# Patient Record
Sex: Female | Born: 1955 | Race: Black or African American | Hispanic: No | Marital: Single | Smoking: Never smoker
Health system: Southern US, Community
[De-identification: ages and names within clinical notes are randomized; demographics above are authoritative.]

## PROBLEM LIST (undated history)

## (undated) HISTORY — PX: TONSILLECTOMY: SUR1361

---
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
## 2000-12-21 ENCOUNTER — Other Ambulatory Visit: Admission: RE | Admit: 2000-12-21 | Discharge: 2000-12-21 | Payer: Self-pay | Admitting: Family Medicine

## 2000-12-22 ENCOUNTER — Encounter: Payer: Self-pay | Admitting: Family Medicine

## 2000-12-22 ENCOUNTER — Ambulatory Visit (HOSPITAL_COMMUNITY): Admission: RE | Admit: 2000-12-22 | Discharge: 2000-12-22 | Payer: Self-pay | Admitting: Family Medicine

## 2001-06-28 ENCOUNTER — Encounter: Payer: Self-pay | Admitting: Family Medicine

## 2001-06-28 ENCOUNTER — Ambulatory Visit (HOSPITAL_COMMUNITY): Admission: RE | Admit: 2001-06-28 | Discharge: 2001-06-28 | Payer: Self-pay | Admitting: Family Medicine

## 2002-07-11 ENCOUNTER — Ambulatory Visit (HOSPITAL_COMMUNITY): Admission: RE | Admit: 2002-07-11 | Discharge: 2002-07-11 | Payer: Self-pay | Admitting: Family Medicine

## 2002-07-11 ENCOUNTER — Encounter: Payer: Self-pay | Admitting: Family Medicine

## 2003-07-13 ENCOUNTER — Ambulatory Visit (HOSPITAL_COMMUNITY): Admission: RE | Admit: 2003-07-13 | Discharge: 2003-07-13 | Payer: Self-pay | Admitting: Family Medicine

## 2004-07-24 ENCOUNTER — Ambulatory Visit (HOSPITAL_COMMUNITY): Admission: RE | Admit: 2004-07-24 | Discharge: 2004-07-24 | Payer: Self-pay | Admitting: Family Medicine

## 2005-07-09 ENCOUNTER — Ambulatory Visit (HOSPITAL_COMMUNITY): Admission: RE | Admit: 2005-07-09 | Discharge: 2005-07-09 | Payer: Self-pay | Admitting: Family Medicine

## 2007-07-15 ENCOUNTER — Other Ambulatory Visit: Admission: RE | Admit: 2007-07-15 | Discharge: 2007-07-15 | Payer: Self-pay | Admitting: Family Medicine

## 2007-07-19 ENCOUNTER — Ambulatory Visit (HOSPITAL_COMMUNITY): Admission: RE | Admit: 2007-07-19 | Discharge: 2007-07-19 | Payer: Self-pay | Admitting: Family Medicine

## 2010-07-23 ENCOUNTER — Other Ambulatory Visit
Admission: RE | Admit: 2010-07-23 | Discharge: 2010-07-23 | Payer: Self-pay | Source: Home / Self Care | Admitting: Family Medicine

## 2010-08-04 ENCOUNTER — Ambulatory Visit (HOSPITAL_COMMUNITY)
Admission: RE | Admit: 2010-08-04 | Discharge: 2010-08-04 | Payer: Self-pay | Source: Home / Self Care | Attending: Family Medicine | Admitting: Family Medicine

## 2012-07-14 ENCOUNTER — Other Ambulatory Visit (HOSPITAL_COMMUNITY)
Admission: RE | Admit: 2012-07-14 | Discharge: 2012-07-14 | Disposition: A | Discharge status: Home / Self Care | Payer: PRIVATE HEALTH INSURANCE | Source: Ambulatory Visit | Attending: Family Medicine | Admitting: Family Medicine

## 2012-07-14 DIAGNOSIS — Z01419 Encounter for gynecological examination (general) (routine) without abnormal findings: Secondary | ICD-10-CM | POA: Insufficient documentation

## 2012-07-14 DIAGNOSIS — Z1151 Encounter for screening for human papillomavirus (HPV): Secondary | ICD-10-CM | POA: Insufficient documentation

## 2012-07-25 ENCOUNTER — Other Ambulatory Visit (HOSPITAL_COMMUNITY): Payer: Self-pay | Admitting: Family Medicine

## 2012-07-25 DIAGNOSIS — Z139 Encounter for screening, unspecified: Secondary | ICD-10-CM

## 2012-07-26 ENCOUNTER — Ambulatory Visit (HOSPITAL_COMMUNITY)
Admission: RE | Admit: 2012-07-26 | Discharge: 2012-07-26 | Disposition: A | Discharge status: Home / Self Care | Payer: PRIVATE HEALTH INSURANCE | Source: Ambulatory Visit | Attending: Family Medicine | Admitting: Family Medicine

## 2012-07-26 DIAGNOSIS — Z1231 Encounter for screening mammogram for malignant neoplasm of breast: Secondary | ICD-10-CM | POA: Insufficient documentation

## 2012-07-26 DIAGNOSIS — Z139 Encounter for screening, unspecified: Secondary | ICD-10-CM

## 2016-06-08 ENCOUNTER — Ambulatory Visit (INDEPENDENT_AMBULATORY_CARE_PROVIDER_SITE_OTHER): Payer: PRIVATE HEALTH INSURANCE | Admitting: Family Medicine

## 2016-06-08 ENCOUNTER — Encounter: Payer: Self-pay | Admitting: Family Medicine

## 2016-06-08 VITALS — BP 128/64 | HR 88 | Temp 98.3°F | Resp 14 | Ht 66.0 in | Wt 194.0 lb

## 2016-06-08 DIAGNOSIS — Z23 Encounter for immunization: Secondary | ICD-10-CM | POA: Diagnosis not present

## 2016-06-08 DIAGNOSIS — Z0000 Encounter for general adult medical examination without abnormal findings: Secondary | ICD-10-CM

## 2016-06-08 DIAGNOSIS — Z1239 Encounter for other screening for malignant neoplasm of breast: Secondary | ICD-10-CM

## 2016-06-08 DIAGNOSIS — E669 Obesity, unspecified: Secondary | ICD-10-CM | POA: Insufficient documentation

## 2016-06-08 DIAGNOSIS — E6609 Other obesity due to excess calories: Secondary | ICD-10-CM | POA: Diagnosis not present

## 2016-06-08 DIAGNOSIS — Z124 Encounter for screening for malignant neoplasm of cervix: Secondary | ICD-10-CM

## 2016-06-08 DIAGNOSIS — Z13220 Encounter for screening for lipoid disorders: Secondary | ICD-10-CM | POA: Diagnosis not present

## 2016-06-08 DIAGNOSIS — Z1211 Encounter for screening for malignant neoplasm of colon: Secondary | ICD-10-CM | POA: Diagnosis not present

## 2016-06-08 DIAGNOSIS — Z1231 Encounter for screening mammogram for malignant neoplasm of breast: Secondary | ICD-10-CM

## 2016-06-08 DIAGNOSIS — R1030 Lower abdominal pain, unspecified: Secondary | ICD-10-CM | POA: Diagnosis not present

## 2016-06-08 DIAGNOSIS — Z6830 Body mass index (BMI) 30.0-30.9, adult: Secondary | ICD-10-CM

## 2016-06-08 LAB — CBC WITH DIFFERENTIAL/PLATELET
BASOS PCT: 0 %
Basophils Absolute: 0 cells/uL (ref 0–200)
EOS ABS: 308 {cells}/uL (ref 15–500)
EOS PCT: 4 %
HCT: 42.1 % (ref 35.0–45.0)
HEMOGLOBIN: 13.9 g/dL (ref 12.0–15.0)
LYMPHS ABS: 2464 {cells}/uL (ref 850–3900)
Lymphocytes Relative: 32 %
MCH: 27.9 pg (ref 27.0–33.0)
MCHC: 33 g/dL (ref 32.0–36.0)
MCV: 84.5 fL (ref 80.0–100.0)
MONOS PCT: 7 %
MPV: 11.2 fL (ref 7.5–12.5)
Monocytes Absolute: 539 cells/uL (ref 200–950)
NEUTROS ABS: 4389 {cells}/uL (ref 1500–7800)
Neutrophils Relative %: 57 %
PLATELETS: 270 10*3/uL (ref 140–400)
RBC: 4.98 MIL/uL (ref 3.80–5.10)
RDW: 15.9 % — ABNORMAL HIGH (ref 11.0–15.0)
WBC: 7.7 10*3/uL (ref 3.8–10.8)

## 2016-06-08 LAB — COMPREHENSIVE METABOLIC PANEL
ALK PHOS: 57 U/L (ref 33–130)
ALT: 17 U/L (ref 6–29)
AST: 19 U/L (ref 10–35)
Albumin: 4 g/dL (ref 3.6–5.1)
BILIRUBIN TOTAL: 0.4 mg/dL (ref 0.2–1.2)
BUN: 12 mg/dL (ref 7–25)
CO2: 33 mmol/L — ABNORMAL HIGH (ref 20–31)
CREATININE: 0.84 mg/dL (ref 0.50–1.05)
Calcium: 8.9 mg/dL (ref 8.6–10.4)
Chloride: 105 mmol/L (ref 98–110)
Glucose, Bld: 80 mg/dL (ref 70–99)
Potassium: 4.5 mmol/L (ref 3.5–5.3)
SODIUM: 141 mmol/L (ref 135–146)
TOTAL PROTEIN: 6.8 g/dL (ref 6.1–8.1)

## 2016-06-08 LAB — URINALYSIS, ROUTINE W REFLEX MICROSCOPIC
Bilirubin Urine: NEGATIVE
Glucose, UA: NEGATIVE
Ketones, ur: NEGATIVE
LEUKOCYTES UA: NEGATIVE
NITRITE: NEGATIVE
PH: 7 (ref 5.0–8.0)
Protein, ur: NEGATIVE
SPECIFIC GRAVITY, URINE: 1.02 (ref 1.001–1.035)

## 2016-06-08 LAB — LIPID PANEL
CHOLESTEROL: 147 mg/dL (ref <200)
HDL: 65 mg/dL (ref >50)
LDL Cholesterol: 67 mg/dL (ref <100)
Total CHOL/HDL Ratio: 2.3 Ratio (ref <5.0)
Triglycerides: 76 mg/dL (ref <150)
VLDL: 15 mg/dL (ref <30)

## 2016-06-08 LAB — URINALYSIS, MICROSCOPIC ONLY
CRYSTALS: NONE SEEN [HPF]
Casts: NONE SEEN [LPF]
Yeast: NONE SEEN [HPF]

## 2016-06-08 NOTE — Patient Instructions (Signed)
TDAP given I recommend eye visit once a year I recommend dental visit every 6 months Goal is to  Exercise 30 minutes 5 days a week We will call with lab results  F/U pending results

## 2016-06-08 NOTE — Progress Notes (Signed)
   Subjective:    Patient ID: Hannah Malone, female    DOB: [birth year], 60 y.o.   MRN: [id number]  Patient presents for New Patient CPE (is not fasting) Pt here for Physical with PAP and Smear  Previous PCP Dr. Mirna MiresGerald Hill Last seen 3 years ago. She does not follow with the GYN she is due for Pap smear, mammogram, colonoscopy, fasting labs She is single she does not have any children her sister is a patient of mine She does see a chiropractor as needed for adjustments on her back and her hips. Flu shot done at work Family history reviewed  The past couple weeks she has noted some heaviness in the lower abdominal region she denies any vaginal bleeding no change in her bowels no dysuria denies any actual pain no nausea vomiting   Review Of Systems:  GEN- denies fatigue, fever, weight loss,weakness, recent illness HEENT- denies eye drainage, change in vision, nasal discharge, CVS- denies chest pain, palpitations RESP- denies SOB, cough, wheeze ABD- denies N/V, change in stools, +abd pain GU- denies dysuria, hematuria, dribbling, incontinence MSK- denies joint pain, muscle aches, injury Neuro- denies headache, dizziness, syncope, seizure activity       Objective:    BP 128/64 (BP Location: Left Arm, Patient Position: Sitting, Cuff Size: Large)   Pulse 88   Temp 98.3 F (36.8 C) (Oral)   Resp 14   Ht 5\' 6"  (1.676 m)   Wt 194 lb (88 kg)   SpO2 98%   BMI 31.31 kg/m  GEN- NAD, alert and oriented x3 HEENT- PERRL, EOMI, non injected sclera, pink conjunctiva, MMM, oropharynx clear Neck- Supple, no thyromegaly Breast- normal symmetry, no nipple inversion,no nipple drainage, no nodules or lumps felt Nodes- no axillary nodes CVS- RRR, no murmur RESP-CTAB ABD-NABS,soft,NT,ND, no CVA tendermess GU- normal external genitalia, vaginal mucosa pink and moist, cervix visualized no growth, no blood form os, minimal thin clear discharge, no CMT, no ovarian masses, uterus normal size EXT-  No edema Psych- normal affect and mood  Pulses- Radial, DP- 2+        Assessment & Plan:      Problem List Items Addressed This Visit    Obesity   Relevant Orders   Lipid panel    Other Visit Diagnoses    Routine general medical examination at a health care facility    -  Primary   CPE Done, fasting labs, UA neg, abd pain may be some bloating/gas. Increae fiber water. schedule Mammo, colonoscopy, PAP today,TDAP done   Relevant Orders   CBC with Differential/Platelet   Comprehensive metabolic panel   Lipid panel   TSH   Screening cholesterol level       Cervical cancer screening       Relevant Orders   PAP, ThinPrep ASCUS Rflx HPV Rflx Type   Breast cancer screening       Relevant Orders   MM DIGITAL SCREENING BILATERAL   Abdominal pain, lower       Relevant Orders   Urinalysis, Routine w reflex microscopic ([facility]) (Completed)   Colon cancer screening       Relevant Orders   Ambulatory referral to Gastroenterology   Need for prophylactic vaccination with combined diphtheria-tetanus-pertussis (DTP) vaccine          Note: This dictation was prepared with Dragon dictation along with smaller phrase technology. Any transcriptional errors that result from this process are unintentional.

## 2016-06-09 LAB — TSH: TSH: 4.18 mIU/L

## 2016-06-10 ENCOUNTER — Encounter (INDEPENDENT_AMBULATORY_CARE_PROVIDER_SITE_OTHER): Payer: Self-pay | Admitting: *Deleted

## 2016-06-11 LAB — PAP THINPREP ASCUS RFLX HPV RFLX TYPE

## 2016-06-12 ENCOUNTER — Encounter: Payer: Self-pay | Admitting: *Deleted

## 2016-06-24 ENCOUNTER — Other Ambulatory Visit: Payer: Self-pay | Admitting: Family Medicine

## 2016-06-24 ENCOUNTER — Ambulatory Visit (HOSPITAL_COMMUNITY): Payer: PRIVATE HEALTH INSURANCE

## 2016-06-24 ENCOUNTER — Ambulatory Visit (HOSPITAL_COMMUNITY)
Admission: RE | Admit: 2016-06-24 | Discharge: 2016-06-24 | Disposition: A | Discharge status: Home / Self Care | Payer: PRIVATE HEALTH INSURANCE | Source: Ambulatory Visit | Attending: Family Medicine | Admitting: Family Medicine

## 2016-06-24 DIAGNOSIS — Z1231 Encounter for screening mammogram for malignant neoplasm of breast: Secondary | ICD-10-CM | POA: Diagnosis present

## 2017-06-09 ENCOUNTER — Ambulatory Visit (INDEPENDENT_AMBULATORY_CARE_PROVIDER_SITE_OTHER): Payer: Self-pay | Admitting: Nurse Practitioner

## 2017-06-09 ENCOUNTER — Encounter: Payer: Self-pay | Admitting: Gastroenterology

## 2017-06-09 ENCOUNTER — Other Ambulatory Visit: Payer: Self-pay | Admitting: Family Medicine

## 2017-06-09 VITALS — BP 115/70 | HR 78 | Temp 97.7°F | Resp 16 | Wt 195.4 lb

## 2017-06-09 DIAGNOSIS — Z0000 Encounter for general adult medical examination without abnormal findings: Secondary | ICD-10-CM

## 2017-06-09 DIAGNOSIS — Z1231 Encounter for screening mammogram for malignant neoplasm of breast: Secondary | ICD-10-CM

## 2017-06-09 NOTE — Progress Notes (Signed)
Subjective:  Hannah Malone is a 61 y.o. female who presents for basic physical exam. Patient needs a physical for work.  Patient denies any current health related concerns. Patient denies taking any medications on a daily basis.  Patient denies any medication or food allergies.  Patient states there is a familial history of DM on her father's side.  Patient does not smoke, drink or use recreational drugs.  Patient states she got her flu shot in October, 2018.  Patient states she would like to lose weight.  Immunization History  Administered Date(s) Administered  . Influenza-Unspecified 05/01/1996, 05/22/1997, 04/29/2016  . Pneumococcal Polysaccharide-23 05/01/1996  . Tdap 06/08/2016    No past medical history on file.  Past Surgical History:  Procedure Laterality Date  . TONSILLECTOMY      Social History   Tobacco Use  . Smoking status: Never Smoker  . Smokeless tobacco: Never Used  Substance Use Topics  . Alcohol use: Yes    Comment: occ  . Drug use: No    No Known Allergies  Current Outpatient Medications  Medication Sig Dispense Refill  . aspirin EC 81 MG tablet Take 81 mg by mouth daily.    . multivitamin-iron-minerals-folic acid (CENTRUM) chewable tablet Chew 1 tablet by mouth daily.     No current facility-administered medications for this visit.     Review of Systems  Constitutional: Negative.   HENT: Negative.   Eyes: Negative.   Respiratory: Negative.   Cardiovascular: Negative.   Gastrointestinal: Negative.   Genitourinary: Negative.   Musculoskeletal: Negative.   Skin: Negative.   Neurological: Negative.   Endo/Heme/Allergies: Negative.   Psychiatric/Behavioral: Negative.     BP 115/70 (BP Location: Right Arm, Patient Position: Sitting, Cuff Size: Normal)   Pulse 78   Temp 97.7 F (36.5 C) (Oral)   Resp 16   Wt 195 lb 6.4 oz (88.6 kg)   SpO2 98%   BMI 31.54 kg/m    Objective:  BP 115/70 (BP Location: Right Arm, Patient Position: Sitting,  Cuff Size: Normal)   Pulse 78   Temp 97.7 F (36.5 C) (Oral)   Resp 16   Wt 195 lb 6.4 oz (88.6 kg)   SpO2 98%   BMI 31.54 kg/m   General Appearance:  Alert, cooperative, no distress, appears stated age  Head:  Normocephalic, without obvious abnormality, atraumatic  Eyes:  PERRL, conjunctiva/corneas clear, EOM's intact, fundi benign, both eyes  Ears:  Normal TM's and external ear canals, both ears  Nose: Nares normal, septum midline,mucosa normal, no drainage or sinus tenderness  Throat: Lips, mucosa, and tongue normal; teeth and gums normal  Neck: Supple, symmetrical, trachea midline, no adenopathy;  thyroid: not enlarged, symmetric, no tenderness/mass/nodules; no carotid bruit or JVD  Back:   Symmetric, no curvature, ROM normal, no CVA tenderness  Lungs:   Clear to auscultation bilaterally, respirations unlabored  Breasts:  No masses or tenderness  Heart:  Regular rate and rhythm, S1 and S2 normal, no murmur, rub, or gallop  Abdomen:   Soft, non-tender, bowel sounds active all four quadrants,  no masses, no organomegaly  Pelvic: Deferred  Extremities: Extremities normal, atraumatic, no cyanosis or edema  Pulses: 2+ and symmetric  Skin: Skin color, texture, turgor normal, no rashes or lesions  Lymph nodes: Cervical, supraclavicular normal  Neurologic: Normal        Assessment:  basic physical exam    Plan:  Patient education provided regarding weight gain, and how to prevent hypertension and hyperlipidemia.  No labs needed at this time.  Patient will follow up with PCP. Patient will follow up with PCP.

## 2017-06-09 NOTE — Patient Instructions (Addendum)
Preventing Unhealthy Weight Gain, Adult Staying at a healthy weight is important. When fat builds up in your body, you may become overweight or obese. These conditions put you at greater risk for developing certain health problems, such as heart disease, diabetes, sleeping problems, joint problems, and some cancers. Unhealthy weight gain is often the result of making unhealthy choices in what you eat. It is also a result of not getting enough exercise. You can make changes to your lifestyle to prevent obesity and stay as healthy as possible. What nutrition changes can be made? To maintain a healthy weight and prevent obesity:  Eat only as much as your body needs. To do this: ? Pay attention to signs that you are hungry or full. Stop eating as soon as you feel full. ? If you feel hungry, try drinking water first. Drink enough water so your urine is clear or pale yellow. ? Eat smaller portions. ? Look at serving sizes on food labels. Most foods contain more than one serving per container. ? Eat the recommended amount of calories for your gender and activity level. While most active people should eat around 2,000 calories per day, if you are trying to lose weight or are not very active, you main need to eat less calories. Talk to your health care provider or dietitian about how many calories you should eat each day.  Choose healthy foods, such as: ? Fruits and vegetables. Try to fill at least half of your plate at each meal with fruits and vegetables. ? Whole grains, such as whole wheat bread, brown rice, and quinoa. ? Lean meats, such as chicken or fish. ? Other healthy proteins, such as beans, eggs, or tofu. ? Healthy fats, such as nuts, seeds, fatty fish, and olive oil. ? Low-fat or fat-free dairy.  Check food labels and avoid food and drinks that: ? Are high in calories. ? Have added sugar. ? Are high in sodium. ? Have saturated fats or trans fats.  Limit how much you eat of the following  foods: ? Prepackaged meals. ? Fast food. ? Fried foods. ? Processed meat, such as bacon, sausage, and deli meats. ? Fatty cuts of red meat and poultry with skin.  Cook foods in healthier ways, such as by baking, broiling, or grilling.  When grocery shopping, try to shop around the outside of the store. This helps you buy mostly fresh foods and avoid canned and prepackaged foods.  What lifestyle changes can be made?  Exercise at least 30 minutes 5 or more days each week. Exercising includes brisk walking, yard work, biking, running, swimming, and team sports like basketball and soccer. Ask your health care provider which exercises are safe for you.  Do not use any products that contain nicotine or tobacco, such as cigarettes and e-cigarettes. If you need help quitting, ask your health care provider.  Limit alcohol intake to no more than 1 drink a day for nonpregnant women and 2 drinks a day for men. One drink equals 12 oz of beer, 5 oz of wine, or 1 oz of hard liquor.  Try to get 7-9 hours of sleep each night. What other changes can be made?  Keep a food and activity journal to keep track of: ? What you ate and how many calories you had. Remember to count sauces, dressings, and side dishes. ? Whether you were active, and what exercises you did. ? Your calorie, weight, and activity goals.  Check your weight regularly. Track any changes.   If you notice you have gained weight, make changes to your diet or activity routine.  Avoid taking weight-loss medicines or supplements. Talk to your health care provider before starting any new medicine or supplement.  Talk to your health care provider before trying any new diet or exercise plan. Why are these changes important? Eating healthy, staying active, and having healthy habits not only help prevent obesity, they also:  Help you to manage stress and emotions.  Help you to connect with friends and family.  Improve your  self-esteem.  Improve your sleep.  Prevent long-term health problems.  What can happen if changes are not made? Being obese or overweight can cause you to develop joint or bone problems, which can make it hard for you to stay active or do activities you enjoy. Being obese or overweight also puts stress on your heart and lungs and can lead to health problems like diabetes, heart disease, and some cancers. Where to find more information: Talk with your health care provider or a dietitian about healthy eating and healthy lifestyle choices. You may also find other information through these resources:  U.S. Department of Agriculture MyPlate: https://ball-collins.biz/www.choosemyplate.gov  American Heart Association: www.heart.org  Centers for Disease Control and Prevention: FootballExhibition.com.brwww.cdc.gov  Summary  Staying at a healthy weight is important. It helps prevent certain diseases and health problems, such as heart disease, diabetes, joint problems, sleep disorders, and some cancers.  Being obese or overweight can cause you to develop joint or bone problems, which can make it hard for you to stay active or do activities you enjoy.  You can prevent unhealthy weight gain by eating a healthy diet, exercising regularly, not smoking, limiting alcohol, and getting enough sleep.  Talk with your health care provider or a dietitian for guidance about healthy eating and healthy lifestyle choices. This information is not intended to replace advice given to you by your health care provider. Make sure you discuss any questions you have with your health care provider. Document Released: 07/14/2016 Document Revised: 08/19/2016 Document Reviewed: 08/19/2016 Elsevier Interactive Patient Education  2018 [employer]  Heart-Healthy Eating Plan Many factors influence your heart health, including eating and exercise habits. Heart (coronary) risk increases with abnormal blood fat (lipid) levels. Heart-healthy meal planning includes limiting  unhealthy fats, increasing healthy fats, and making other small dietary changes. This includes maintaining a healthy body weight to help keep lipid levels within a normal range. What is my plan? Your health care provider recommends that you:  Get no more than _________% of the total calories in your daily diet from fat.  Limit your intake of saturated fat to less than _________% of your total calories each day.  Limit the amount of cholesterol in your diet to less than _________ mg per day.  What types of fat should I choose?  Choose healthy fats more often. Choose monounsaturated and polyunsaturated fats, such as olive oil and canola oil, flaxseeds, walnuts, almonds, and seeds.  Eat more omega-3 fats. Good choices include salmon, mackerel, sardines, tuna, flaxseed oil, and ground flaxseeds. Aim to eat fish at least two times each week.  Limit saturated fats. Saturated fats are primarily found in animal products, such as meats, butter, and cream. Plant sources of saturated fats include palm oil, palm kernel oil, and coconut oil.  Avoid foods with partially hydrogenated oils in them. These contain trans fats. Examples of foods that contain trans fats are stick margarine, some tub margarines, cookies, crackers, and other baked goods. What  general guidelines do I need to follow?  Check food labels carefully to identify foods with trans fats or high amounts of saturated fat.  Fill one half of your plate with vegetables and green salads. Eat 4-5 servings of vegetables per day. A serving of vegetables equals 1 cup of raw leafy vegetables,  cup of raw or cooked cut-up vegetables, or  cup of vegetable juice.  Fill one fourth of your plate with whole grains. Look for the word "whole" as the first word in the ingredient list.  Fill one fourth of your plate with lean protein foods.  Eat 4-5 servings of fruit per day. A serving of fruit equals one medium whole fruit,  cup of dried fruit,  cup of  fresh, frozen, or canned fruit, or  cup of 100% fruit juice.  Eat more foods that contain soluble fiber. Examples of foods that contain this type of fiber are apples, broccoli, carrots, beans, peas, and barley. Aim to get 20-30 g of fiber per day.  Eat more home-cooked food and less restaurant, buffet, and fast food.  Limit or avoid alcohol.  Limit foods that are high in starch and sugar.  Avoid fried foods.  Cook foods by using methods other than frying. Baking, boiling, grilling, and broiling are all great options. Other fat-reducing suggestions include: ? Removing the skin from poultry. ? Removing all visible fats from meats. ? Skimming the fat off of stews, soups, and gravies before serving them. ? Steaming vegetables in water or broth.  Lose weight if you are overweight. Losing just 5-10% of your initial body weight can help your overall health and prevent diseases such as diabetes and heart disease.  Increase your consumption of nuts, legumes, and seeds to 4-5 servings per week. One serving of dried beans or legumes equals  cup after being cooked, one serving of nuts equals 1 ounces, and one serving of seeds equals  ounce or 1 tablespoon.  You may need to monitor your salt (sodium) intake, especially if you have high blood pressure. Talk with your health care provider or dietitian to get more information about reducing sodium. What foods can I eat? Grains  Breads, including [place], white, pita, wheat, raisin, rye, oatmeal, and [place]. Tortillas that are neither fried nor made with lard or trans fat. Low-fat rolls, including hotdog and hamburger buns and English muffins. Biscuits. Muffins. Waffles. Pancakes. Light popcorn. Whole-grain cereals. Flatbread. Melba toast. Pretzels. Breadsticks. Rusks. Low-fat snacks and crackers, including oyster, saltine, matzo, graham, animal, and rye. Rice and pasta, including brown rice and those that are made with whole wheat. Vegetables All  vegetables. Fruits All fruits, but limit coconut. Meats and Other Protein Sources Lean, well-trimmed beef, veal, pork, and lamb. Chicken and [place] without skin. All fish and shellfish. Wild duck, rabbit, pheasant, and venison. Egg whites or low-cholesterol egg substitutes. Dried beans, peas, lentils, and tofu.Seeds and most nuts. Dairy Low-fat or nonfat cheeses, including ricotta, string, and mozzarella. Skim or 1% milk that is liquid, powdered, or evaporated. Buttermilk that is made with low-fat milk. Nonfat or low-fat yogurt. Beverages Mineral water. Diet carbonated beverages. Sweets and Desserts Sherbets and fruit ices. Honey, jam, marmalade, jelly, and syrups. Meringues and gelatins. Pure sugar candy, such as hard candy, jelly beans, gumdrops, mints, marshmallows, and small amounts of dark chocolate. [employer]. Eat all sweets and desserts in moderation. Fats and Oils Nonhydrogenated (trans-free) margarines. Vegetable oils, including soybean, sesame, sunflower, olive, peanut, safflower, corn, canola, and cottonseed. Salad dressings  or mayonnaise that are made with a vegetable oil. Limit added fats and oils that you use for cooking, baking, salads, and as spreads. Other Cocoa powder. Coffee and tea. All seasonings and condiments. The items listed above may not be a complete list of recommended foods or beverages. Contact your dietitian for more options. What foods are not recommended? Grains Breads that are made with saturated or trans fats, oils, or whole milk. Croissants. Butter rolls. Cheese breads. Sweet rolls. Donuts. Buttered popcorn. Chow mein noodles. High-fat crackers, such as cheese or butter crackers. Meats and Other Protein Sources Fatty meats, such as hotdogs, short ribs, sausage, spareribs, bacon, ribeye roast or steak, and mutton. High-fat deli meats, such as salami and bologna. Caviar. Domestic duck and goose. Organ meats, such as kidney, liver, sweetbreads, brains,  gizzard, chitterlings, and heart. Dairy Cream, sour cream, cream cheese, and creamed cottage cheese. Whole milk cheeses, including blue (bleu), [address], [place], [place], [address], [place], [address], [place], [place], and [place]. Whole or 2% milk that is liquid, evaporated, or condensed. Whole buttermilk. Cream sauce or high-fat cheese sauce. Yogurt that is made from whole milk. Beverages Regular sodas and drinks with added sugar. Sweets and Desserts Frosting. Pudding. Cookies. Cakes other than angel food cake. Candy that has milk chocolate or white chocolate, hydrogenated fat, butter, coconut, or unknown ingredients. Buttered syrups. Full-fat ice cream or ice cream drinks. Fats and Oils Gravy that has suet, meat fat, or shortening. Cocoa butter, hydrogenated oils, palm oil, coconut oil, palm kernel oil. These can often be found in baked products, candy, fried foods, nondairy creamers, and whipped toppings. Solid fats and shortenings, including bacon fat, salt pork, lard, and butter. Nondairy cream substitutes, such as coffee creamers and sour cream substitutes. Salad dressings that are made of unknown oils, cheese, or sour cream. The items listed above may not be a complete list of foods and beverages to avoid. Contact your dietitian for more information. This information is not intended to replace advice given to you by your health care provider. Make sure you discuss any questions you have with your health care provider. Document Released: 04/21/2008 Document Revised: 01/31/2016 Document Reviewed: 01/04/2014 Elsevier Interactive Patient Education  2017 Elsevier Inc.  Preventing High Cholesterol Cholesterol is a waxy, fat-like substance that your body needs in small amounts. Your liver makes all the cholesterol that your body needs. Having high cholesterol (hypercholesterolemia) increases your risk for heart disease and stroke. Extra (excess) cholesterol comes from the food you eat, such as  animal-based fat (saturated fat) from meat and some dairy products. High cholesterol can often be prevented with diet and lifestyle changes. If you already have high cholesterol, you can control it with diet and lifestyle changes, as well as medicine. What nutrition changes can be made?  Eat less saturated fat. Foods that contain saturated fat include red meat and some dairy products.  Avoid processed meats, like bacon and lunch meats.  Avoid trans fats, which are found in margarine and some baked goods.  Avoid foods and beverages that have added sugars.  Eat more fruits, vegetables, and whole grains.  Choose healthy sources of protein, such as fish, poultry, and nuts.  Choose healthy sources of fat, such as: ? Nuts. ? Vegetable oils, especially olive oil. ? Fish that have healthy fats (omega-3 fatty acids), such as mackerel or salmon. What lifestyle changes can be made?  Lose weight if you are overweight. Losing 5-10 lb (2.3-4.5 kg) can help prevent or control high cholesterol and  reduce your risk for diabetes and high blood pressure. Ask your health care provider to help you with a diet and exercise plan to safely lose weight.  Get enough exercise. Do at least 150 minutes of moderate-intensity exercise each week. ? You could do this in short exercise sessions several times a day, or you could do longer exercise sessions a few times a week. For example, you could take a brisk 10-minute walk or bike ride, 3 times a day, for 5 days a week.  Do not smoke. If you need help quitting, ask your health care provider.  Limit your alcohol intake. If you drink alcohol, limit alcohol intake to no more than 1 drink a day for nonpregnant women and 2 drinks a day for men. One drink equals 12 oz of beer, 5 oz of wine, or 1 oz of hard liquor. Why are these changes important? If you have high cholesterol, deposits (plaques) may build up on the walls of your blood vessels. Plaques make the arteries  narrower and stiffer, which can restrict or block blood flow and cause blood clots to form. This greatly increases your risk for heart attack and stroke. Making diet and lifestyle changes can reduce your risk for these life-threatening conditions. What can I do to lower my risk?  Manage your risk factors for high cholesterol. Talk with your health care provider about all of your risk factors and how to lower your risk.  Manage other conditions that you have, such as diabetes or high blood pressure (hypertension).  Have your cholesterol checked at regular intervals.  Keep all follow-up visits as told by your health care provider. This is important. How is this treated? In addition to diet and lifestyle changes, your health care provider may recommend medicines to help lower cholesterol, such as a medicine to reduce the amount of cholesterol made in your liver. You may need medicine if:  Diet and lifestyle changes do not lower your cholesterol enough.  You have high cholesterol and other risk factors for heart disease or stroke.  Take over-the-counter and prescription medicines only as told by your health care provider. Where to find more information:  American Heart Association: 1122334455.jsp  National Heart, Lung, and Blood Institute: http://hood.com/ Summary  High cholesterol increases your risk for heart disease and stroke. By keeping your cholesterol level low, you can reduce your risk for these conditions.  Diet and lifestyle changes are the most important steps in preventing high cholesterol.  Work with your health care provider to manage your risk factors, and have your blood tested regularly. This information is not intended to replace advice given to you by your health care provider. Make sure you discuss any questions you have with your health care  provider. Document Released: 07/28/2015 Document Revised: 03/21/2016 Document Reviewed: 03/21/2016 Elsevier Interactive Patient Education  2018 Elsevier Inc.  Preventing Hypertension Hypertension, commonly called high blood pressure, is when the force of blood pumping through the arteries is too strong. Arteries are blood vessels that carry blood from the heart throughout the body. Over time, hypertension can damage the arteries and decrease blood flow to important parts of the body, including the brain, heart, and kidneys. Often, hypertension does not cause symptoms until blood pressure is very high. For this reason, it is important to have your blood pressure checked on a regular basis. Hypertension can often be prevented with diet and lifestyle changes. If you already have hypertension, you can control it with diet and lifestyle changes, as  well as medicine. What nutrition changes can be made? Maintain a healthy diet. This includes:  Eating less salt (sodium). Ask your health care provider how much sodium is safe for you to have. The general recommendation is to consume less than 1 tsp (2,300 mg) of sodium a day. ? Do not add salt to your food. ? Choose low-sodium options when grocery shopping and eating out.  Limiting fats in your diet. You can do this by eating low-fat or fat-free dairy products and by eating less red meat.  Eating more fruits, vegetables, and whole grains. Make a goal to eat: ? 1-2 cups of fresh fruits and vegetables each day. ? 3-4 servings of whole grains each day.  Avoiding foods and beverages that have added sugars.  Eating fish that contain healthy fats (omega-3 fatty acids), such as mackerel or salmon.  If you need help putting together a healthy eating plan, try the DASH diet. This diet is high in fruits, vegetables, and whole grains. It is low in sodium, red meat, and added sugars. DASH stands for Dietary Approaches to Stop Hypertension. What lifestyle  changes can be made?  Lose weight if you are overweight. Losing just 3?5% of your body weight can help prevent or control hypertension. ? For example, if your present weight is 200 lb (91 kg), a loss of 3-5% of your weight means losing 6-10 lb (2.7-4.5 kg). ? Ask your health care provider to help you with a diet and exercise plan to safely lose weight.  Get enough exercise. Do at least 150 minutes of moderate-intensity exercise each week. ? You could do this in short exercise sessions several times a day, or you could do longer exercise sessions a few times a week. For example, you could take a brisk 10-minute walk or bike ride, 3 times a day, for 5 days a week.  Find ways to reduce stress, such as exercising, meditating, listening to music, or taking a yoga class. If you need help reducing stress, ask your health care provider.  Do not smoke. This includes e-cigarettes. Chemicals in tobacco and nicotine products raise your blood pressure each time you smoke. If you need help quitting, ask your health care provider.  Avoid alcohol. If you drink alcohol, limit alcohol intake to no more than 1 drink a day for nonpregnant women and 2 drinks a day for men. One drink equals 12 oz of beer, 5 oz of wine, or 1 oz of hard liquor. Why are these changes important? Diet and lifestyle changes can help you prevent hypertension, and they may make you feel better overall and improve your quality of life. If you have hypertension, making these changes will help you control it and help prevent major complications, such as:  Hardening and narrowing of arteries that supply blood to: ? Your heart. This can cause a heart attack. ? Your brain. This can cause a stroke. ? Your kidneys. This can cause kidney failure.  Stress on your heart muscle, which can cause heart failure.  What can I do to lower my risk?  Work with your health care provider to make a hypertension prevention plan that works for you. Follow your  plan and keep all follow-up visits as told by your health care provider.  Learn how to check your blood pressure at home. Make sure that you know your personal target blood pressure, as told by your health care provider. How is this treated? In addition to diet and lifestyle changes, your health  care provider may recommend medicines to help lower your blood pressure. You may need to try a few different medicines to find what works best for you. You also may need to take more than one medicine. Take over-the-counter and prescription medicines only as told by your health care provider. Where to find support: Your health care provider can help you prevent hypertension and help you keep your blood pressure at a healthy level. Your local hospital or your community may also provide support services and prevention programs. The American Heart Association offers an online support network at: https://www.lee.net/ Where to find more information: Learn more about hypertension from:  National Heart, Lung, and Blood Institute: https://www.peterson.org/  Centers for Disease Control and Prevention: AboutHD.co.nz  American Academy of Family Physicians: http://familydoctor.org/familydoctor/en/diseases-conditions/high-blood-pressure.printerview.all.html  Learn more about the DASH diet from:  National Heart, Lung, and Blood Institute: WedMap.it  Contact a health care provider if:  You think you are having a reaction to medicines you have taken.  You have recurrent headaches or feel dizzy.  You have swelling in your ankles.  You have trouble with your vision. Summary  Hypertension often does not cause any symptoms until blood pressure is very high. It is important to get your blood pressure checked regularly.  Diet and lifestyle changes are the most important steps in preventing  hypertension.  By keeping your blood pressure in a healthy range, you can prevent complications like heart attack, heart failure, stroke, and kidney failure.  Work with your health care provider to make a hypertension prevention plan that works for you. This information is not intended to replace advice given to you by your health care provider. Make sure you discuss any questions you have with your health care provider. Document Released: 07/28/2015 Document Revised: 03/23/2016 Document Reviewed: 03/23/2016 Elsevier Interactive Patient Education  2017 [employer].

## 2017-06-25 ENCOUNTER — Ambulatory Visit (HOSPITAL_COMMUNITY)
Admission: RE | Admit: 2017-06-25 | Discharge: 2017-06-25 | Disposition: A | Discharge status: Home / Self Care | Payer: PRIVATE HEALTH INSURANCE | Source: Ambulatory Visit | Attending: Family Medicine | Admitting: Family Medicine

## 2017-06-25 DIAGNOSIS — Z1231 Encounter for screening mammogram for malignant neoplasm of breast: Secondary | ICD-10-CM | POA: Diagnosis present

## 2017-07-07 ENCOUNTER — Ambulatory Visit (AMBULATORY_SURGERY_CENTER): Payer: Self-pay | Admitting: *Deleted

## 2017-07-07 ENCOUNTER — Other Ambulatory Visit: Payer: Self-pay

## 2017-07-07 VITALS — Ht 67.0 in | Wt 198.0 lb

## 2017-07-07 DIAGNOSIS — Z1211 Encounter for screening for malignant neoplasm of colon: Secondary | ICD-10-CM

## 2017-07-07 MED ORDER — PEG-KCL-NACL-NASULF-NA ASC-C 140 G PO SOLR: 1.0000 | ORAL | 0 refills | Status: DC

## 2017-07-07 NOTE — Progress Notes (Signed)
No egg or soy allergy known to patient  No issues with past sedation with any surgeries  or procedures, no past  intubation  No diet pills per patient No home 02 use per patient  No blood thinners per patient  Pt denies issues with constipation  No A fib or A flutter  EMMI video sent to pt's e mail  Pt given a plenvu pay no more than 50 coupon in PV today

## 2017-07-19 ENCOUNTER — Encounter: Payer: Self-pay | Admitting: Gastroenterology

## 2017-07-19 ENCOUNTER — Other Ambulatory Visit: Payer: Self-pay

## 2017-07-19 ENCOUNTER — Ambulatory Visit (AMBULATORY_SURGERY_CENTER): Payer: PRIVATE HEALTH INSURANCE | Admitting: Gastroenterology

## 2017-07-19 VITALS — BP 110/68 | HR 71 | Temp 96.8°F | Resp 16 | Ht 67.0 in | Wt 198.0 lb

## 2017-07-19 DIAGNOSIS — Z1211 Encounter for screening for malignant neoplasm of colon: Secondary | ICD-10-CM

## 2017-07-19 DIAGNOSIS — Z1212 Encounter for screening for malignant neoplasm of rectum: Secondary | ICD-10-CM | POA: Diagnosis not present

## 2017-07-19 MED ORDER — SODIUM CHLORIDE 0.9 % IV SOLN: 500.0000 mL | INTRAVENOUS | Status: DC

## 2017-07-19 NOTE — Progress Notes (Signed)
Pt's states no medical or surgical changes since previsit or office visit. 

## 2017-07-19 NOTE — Progress Notes (Signed)
A/ox3 pleased with MAC, report to Angela RN 

## 2017-07-19 NOTE — Op Note (Signed)
[facility] Patient Name: Hannah Malone Procedure Date: 07/19/2017 9:28 AM MRN: [id number] Endoscopist: Napoleon Form , MD Age: 61 Referring MD:  Date of Birth: [birth year] Gender: Female Account #: [id number] Procedure:                Colonoscopy Indications:              Screening for colorectal malignant neoplasm Medicines:                Monitored Anesthesia Care Procedure:                Pre-Anesthesia Assessment:                           - Prior to the procedure, a History and Physical                            was performed, and patient medications and                            allergies were reviewed. The patient's tolerance of                            previous anesthesia was also reviewed. The risks                            and benefits of the procedure and the sedation                            options and risks were discussed with the patient.                            All questions were answered, and informed consent                            was obtained. Prior Anticoagulants: The patient has                            taken no previous anticoagulant or antiplatelet                            agents. ASA Grade Assessment: II - A patient with                            mild systemic disease. After reviewing the risks                            and benefits, the patient was deemed in                            satisfactory condition to undergo the procedure.                           After obtaining informed consent, the colonoscope  was passed under direct vision. Throughout the                            procedure, the patient's blood pressure, pulse, and                            oxygen saturations were monitored continuously. The                            Model PCF-[id number](SN#[id number]) scope was introduced                            through the anus and advanced to the the cecum,                            identified  by appendiceal orifice and ileocecal                            valve. The colonoscopy was performed without                            difficulty. The patient tolerated the procedure                            well. The quality of the bowel preparation was                            excellent. The ileocecal valve, appendiceal                            orifice, and rectum were photographed. Scope In: 9:37:15 AM Scope Out: 9:51:02 AM Scope Withdrawal Time: 0 hours 7 minutes 45 seconds  Total Procedure Duration: 0 hours 13 minutes 47 seconds  Findings:                 The perianal and digital rectal examinations were                            normal.                           Non-bleeding internal hemorrhoids were found during                            retroflexion. The hemorrhoids were small. Complications:            No immediate complications. Estimated Blood Loss:     Estimated blood loss: none. Impression:               - Non-bleeding internal hemorrhoids.                           - No specimens collected. Recommendation:           - Patient has a contact number available for  emergencies. The signs and symptoms of potential                            delayed complications were discussed with the                            patient. Return to normal activities tomorrow.                            Written discharge instructions were provided to the                            patient.                           - Resume previous diet.                           - Continue present medications.                           - Repeat colonoscopy in 10 years for screening                            purposes. Napoleon FormKavitha V. Felix Meras, MD 07/19/2017 9:56:05 AM This report has been signed electronically.

## 2017-07-19 NOTE — Patient Instructions (Signed)
**Handout given to patinet on Hemorrhoids**   YOU HAD AN ENDOSCOPIC PROCEDURE TODAY AT THE [facility]:   Refer to the procedure report that was given to you for any specific questions about what was found during the examination.  If the procedure report does not answer your questions, please call your gastroenterologist to clarify.  If you requested that your care partner not be given the details of your procedure findings, then the procedure report has been included in a sealed envelope for you to review at your convenience later.  YOU SHOULD EXPECT: Some feelings of bloating in the abdomen. Passage of more gas than usual.  Walking can help get rid of the air that was put into your GI tract during the procedure and reduce the bloating. If you had a lower endoscopy (such as a colonoscopy or flexible sigmoidoscopy) you may notice spotting of blood in your stool or on the toilet paper. If you underwent a bowel prep for your procedure, you may not have a normal bowel movement for a few days.  Please Note:  You might notice some irritation and congestion in your nose or some drainage.  This is from the oxygen used during your procedure.  There is no need for concern and it should clear up in a day or so.  SYMPTOMS TO REPORT IMMEDIATELY:   Following lower endoscopy (colonoscopy or flexible sigmoidoscopy):  Excessive amounts of blood in the stool  Significant tenderness or worsening of abdominal pains  Swelling of the abdomen that is new, acute  Fever of 100F or higher   For urgent or emergent issues, a gastroenterologist can be reached at any hour by calling [phone].   DIET:  We do recommend a small meal at first, but then you may proceed to your regular diet.  Drink plenty of fluids but you should avoid alcoholic beverages for 24 hours.  ACTIVITY:  You should plan to take it easy for the rest of today and you should NOT DRIVE or use heavy machinery until tomorrow (because of  the sedation medicines used during the test).    FOLLOW UP: Our staff will call the number listed on your records the next business day following your procedure to check on you and address any questions or concerns that you may have regarding the information given to you following your procedure. If we do not reach you, we will leave a message.  However, if you are feeling well and you are not experiencing any problems, there is no need to return our call.  We will assume that you have returned to your regular daily activities without incident.  If any biopsies were taken you will be contacted by phone or by letter within the next 1-3 weeks.  Please call us at [phone] if you have not heard about the biopsies in 3 weeks.    SIGNATURES/CONFIDENTIALITY: You and/or your care partner have signed paperwork which will be entered into your electronic medical record.  These signatures attest to the fact that that the information above on your After Visit Summary has been reviewed and is understood.  Full responsibility of the confidentiality of this discharge information lies with you and/or your care-partner.YOU HAD AN ENDOSCOPIC PROCEDURE TODAY AT THE  ENDOSCOPY CENTER:   Refer to the procedure report that was given to you for any specific questions about what was found during the examination.  If the procedure report does not answer your questions, please call your gastroenterologist to  clarify.  If you requested that your care partner not be given the details of your procedure findings, then the procedure report has been included in a sealed envelope for you to review at your convenience later.  YOU SHOULD EXPECT: Some feelings of bloating in the abdomen. Passage of more gas than usual.  Walking can help get rid of the air that was put into your GI tract during the procedure and reduce the bloating. If you had a lower endoscopy (such as a colonoscopy or flexible sigmoidoscopy) you may notice  spotting of blood in your stool or on the toilet paper. If you underwent a bowel prep for your procedure, you may not have a normal bowel movement for a few days.  Please Note:  You might notice some irritation and congestion in your nose or some drainage.  This is from the oxygen used during your procedure.  There is no need for concern and it should clear up in a day or so.  SYMPTOMS TO REPORT IMMEDIATELY:   Following lower endoscopy (colonoscopy or flexible sigmoidoscopy):  Excessive amounts of blood in the stool  Significant tenderness or worsening of abdominal pains  Swelling of the abdomen that is new, acute  Fever of 100F or higher   For urgent or emergent issues, a gastroenterologist can be reached at any hour by calling [phone].   DIET:  We do recommend a small meal at first, but then you may proceed to your regular diet.  Drink plenty of fluids but you should avoid alcoholic beverages for 24 hours.  ACTIVITY:  You should plan to take it easy for the rest of today and you should NOT DRIVE or use heavy machinery until tomorrow (because of the sedation medicines used during the test).    FOLLOW UP: Our staff will call the number listed on your records the next business day following your procedure to check on you and address any questions or concerns that you may have regarding the information given to you following your procedure. If we do not reach you, we will leave a message.  However, if you are feeling well and you are not experiencing any problems, there is no need to return our call.  We will assume that you have returned to your regular daily activities without incident.  If any biopsies were taken you will be contacted by phone or by letter within the next 1-3 weeks.  Please call us at [phone] if you have not heard about the biopsies in 3 weeks.    SIGNATURES/CONFIDENTIALITY: You and/or your care partner have signed paperwork which will be entered into your  electronic medical record.  These signatures attest to the fact that that the information above on your After Visit Summary has been reviewed and is understood.  Full responsibility of the confidentiality of this discharge information lies with you and/or your care-partner.

## 2017-07-22 ENCOUNTER — Telehealth: Payer: Self-pay

## 2017-07-22 NOTE — Telephone Encounter (Signed)
  Follow up Call-  Call back number 07/19/2017  Post procedure Call Back phone  # [phone]  Permission to leave phone message Yes  Some recent data might be hidden     [facility] Aydenn Gervin/Recovery Room

## 2017-07-22 NOTE — Telephone Encounter (Signed)
Patient called back and states she is doing fine.

## 2017-07-22 NOTE — Telephone Encounter (Signed)
  Follow up Call-  Call back number 07/19/2017  Post procedure Call Back phone  # [phone]  Permission to leave phone message Yes  Some recent data might be hidden     LMOM Angela/Recovery Room 

## 2017-12-22 ENCOUNTER — Encounter: Payer: Self-pay | Admitting: *Deleted

## 2018-03-09 ENCOUNTER — Other Ambulatory Visit: Payer: Self-pay

## 2018-03-09 ENCOUNTER — Other Ambulatory Visit: Payer: Self-pay | Admitting: *Deleted

## 2018-03-09 ENCOUNTER — Ambulatory Visit (INDEPENDENT_AMBULATORY_CARE_PROVIDER_SITE_OTHER): Payer: PRIVATE HEALTH INSURANCE | Admitting: Family Medicine

## 2018-03-09 ENCOUNTER — Encounter: Payer: Self-pay | Admitting: Family Medicine

## 2018-03-09 VITALS — BP 138/64 | HR 82 | Temp 97.7°F | Resp 12 | Ht 67.0 in | Wt 195.0 lb

## 2018-03-09 DIAGNOSIS — Z6830 Body mass index (BMI) 30.0-30.9, adult: Secondary | ICD-10-CM

## 2018-03-09 DIAGNOSIS — Z0000 Encounter for general adult medical examination without abnormal findings: Secondary | ICD-10-CM

## 2018-03-09 DIAGNOSIS — E6609 Other obesity due to excess calories: Secondary | ICD-10-CM | POA: Diagnosis not present

## 2018-03-09 MED ORDER — ZOSTER VAC RECOMB ADJUVANTED 50 MCG/0.5ML IM SUSR: 0.5000 mL | Freq: Once | INTRAMUSCULAR | 1 refills | Status: AC

## 2018-03-09 NOTE — Patient Instructions (Addendum)
F/U 1 year for physical I recommend eye visit once a year I recommend dental visit every 6 months Goal is to  Exercise 30 minutes 5 days a week We will callr with lab results  Schedule your mammogram in November  Shingles vaccine sent to pharmacy

## 2018-03-09 NOTE — Progress Notes (Signed)
   Subjective:    Patient ID: Hannah SabaLinda R Malone, female    DOB: 14-Jan-1956, 62 y.o.   MRN: [id number]  Patient presents for CPE (is fasting)   Pt here for CPE  Due for fasting labs Last visit in November 2017  No current medications   Mammogram UTD- Nov  2018 PAP Smear- UTD Nov 2017- Normal   Colonoscopy UTD  - Dec 2018  Immunizations- TDAP/pneumonia UTD,     No concerns Continues to have weekly chiropracter manipulation Does  Not exercise    Review Of Systems:  GEN- denies fatigue, fever, weight loss,weakness, recent illness HEENT- denies eye drainage, change in vision, nasal discharge, CVS- denies chest pain, palpitations RESP- denies SOB, cough, wheeze ABD- denies N/V, change in stools, abd pain GU- denies dysuria, hematuria, dribbling, incontinence MSK- denies joint pain, muscle aches, injury Neuro- denies headache, dizziness, syncope, seizure activity       Objective:    BP 138/64   Pulse 82   Temp 97.7 F (36.5 C) (Oral)   Resp 12   Ht 5\' 7"  (1.702 m)   Wt 195 lb (88.5 kg)   SpO2 98%   BMI 30.54 kg/m  GEN- NAD, alert and oriented x3 HEENT- PERRL, EOMI, non injected sclera, pink conjunctiva, MMM, oropharynx clear, TM clear bilat no effusion Neck- Supple, no thyromegaly CVS- RRR, no murmur RESP-CTAB ABD-NABS,soft,NT,ND EXT- No edema Pulses- Radial, DP- 2+  CAGE/Depression negative      Assessment & Plan:      Problem List Items Addressed This Visit      Unprioritized   Obesity    Discussed exercise, watching diet       Other Visit Diagnoses    Routine general medical examination at a health care facility    -  Primary   CPE done, fasting labs, shingrix to pharmacy, mammogram in Nov, otherwise prevention UTD    Relevant Orders   CBC with Differential/Platelet   Comprehensive metabolic panel   Lipid panel      Note: This dictation was prepared with Dragon dictation along with smaller phrase technology. Any transcriptional errors that  result from this process are unintentional.

## 2018-03-09 NOTE — Assessment & Plan Note (Signed)
Discussed exercise, watching diet

## 2018-03-10 ENCOUNTER — Encounter: Payer: Self-pay | Admitting: *Deleted

## 2018-03-10 LAB — LIPID PANEL
CHOL/HDL RATIO: 2.3 (calc) (ref <5.0)
Cholesterol: 144 mg/dL (ref <200)
HDL: 62 mg/dL (ref >50)
LDL Cholesterol (Calc): 66 mg/dL (calc)
NON-HDL CHOLESTEROL (CALC): 82 mg/dL (ref <130)
Triglycerides: 82 mg/dL (ref <150)

## 2018-03-10 LAB — COMPREHENSIVE METABOLIC PANEL
AG Ratio: 1.6 (calc) (ref 1.0–2.5)
ALBUMIN MSPROF: 4.1 g/dL (ref 3.6–5.1)
ALT: 16 U/L (ref 6–29)
AST: 18 U/L (ref 10–35)
Alkaline phosphatase (APISO): 63 U/L (ref 33–130)
BUN: 11 mg/dL (ref 7–25)
CHLORIDE: 103 mmol/L (ref 98–110)
CO2: 30 mmol/L (ref 20–32)
CREATININE: 0.75 mg/dL (ref 0.50–0.99)
Calcium: 8.9 mg/dL (ref 8.6–10.4)
GLOBULIN: 2.6 g/dL (ref 1.9–3.7)
GLUCOSE: 93 mg/dL (ref 65–99)
Potassium: 4.5 mmol/L (ref 3.5–5.3)
SODIUM: 140 mmol/L (ref 135–146)
TOTAL PROTEIN: 6.7 g/dL (ref 6.1–8.1)
Total Bilirubin: 0.3 mg/dL (ref 0.2–1.2)

## 2018-03-10 LAB — CBC WITH DIFFERENTIAL/PLATELET
BASOS ABS: 47 {cells}/uL (ref 0–200)
Basophils Relative: 0.6 %
Eosinophils Absolute: 546 cells/uL — ABNORMAL HIGH (ref 15–500)
Eosinophils Relative: 7 %
HCT: 39.1 % (ref 35.0–45.0)
Hemoglobin: 13 g/dL (ref 11.7–15.5)
LYMPHS ABS: 2878 {cells}/uL (ref 850–3900)
MCH: 27.5 pg (ref 27.0–33.0)
MCHC: 33.2 g/dL (ref 32.0–36.0)
MCV: 82.8 fL (ref 80.0–100.0)
MPV: 11.6 fL (ref 7.5–12.5)
Monocytes Relative: 4.9 %
NEUTROS PCT: 50.6 %
Neutro Abs: 3947 cells/uL (ref 1500–7800)
PLATELETS: 275 10*3/uL (ref 140–400)
RBC: 4.72 10*6/uL (ref 3.80–5.10)
RDW: 15 % (ref 11.0–15.0)
Total Lymphocyte: 36.9 %
WBC: 7.8 10*3/uL (ref 3.8–10.8)
WBCMIX: 382 {cells}/uL (ref 200–950)

## 2018-07-07 ENCOUNTER — Other Ambulatory Visit: Payer: Self-pay | Admitting: Family Medicine

## 2018-07-07 DIAGNOSIS — Z1231 Encounter for screening mammogram for malignant neoplasm of breast: Secondary | ICD-10-CM

## 2018-07-15 ENCOUNTER — Ambulatory Visit (HOSPITAL_COMMUNITY)
Admission: RE | Admit: 2018-07-15 | Discharge: 2018-07-15 | Disposition: A | Discharge status: Home / Self Care | Payer: PRIVATE HEALTH INSURANCE | Source: Ambulatory Visit | Attending: Family Medicine | Admitting: Family Medicine

## 2018-07-15 DIAGNOSIS — Z1231 Encounter for screening mammogram for malignant neoplasm of breast: Secondary | ICD-10-CM | POA: Insufficient documentation

## 2018-10-09 ENCOUNTER — Ambulatory Visit: Payer: Self-pay | Admitting: Nurse Practitioner

## 2018-10-09 ENCOUNTER — Other Ambulatory Visit: Payer: Self-pay

## 2018-10-09 VITALS — BP 122/78 | HR 97 | Temp 98.1°F | Resp 14 | Wt 199.0 lb

## 2018-10-09 DIAGNOSIS — J019 Acute sinusitis, unspecified: Secondary | ICD-10-CM

## 2018-10-09 DIAGNOSIS — H1013 Acute atopic conjunctivitis, bilateral: Secondary | ICD-10-CM

## 2018-10-09 DIAGNOSIS — B9689 Other specified bacterial agents as the cause of diseases classified elsewhere: Secondary | ICD-10-CM

## 2018-10-09 MED ORDER — CETIRIZINE HCL 10 MG PO TABS: 10.0000 mg | ORAL_TABLET | Freq: Every day | ORAL | 0 refills | Status: DC

## 2018-10-09 MED ORDER — FLUTICASONE PROPIONATE 50 MCG/ACT NA SUSP: 2.0000 | Freq: Every day | NASAL | 0 refills | Status: DC

## 2018-10-09 MED ORDER — OLOPATADINE HCL 0.2 % OP SOLN: 1.0000 [drp] | Freq: Every day | OPHTHALMIC | 0 refills | Status: AC

## 2018-10-09 MED ORDER — AMOXICILLIN-POT CLAVULANATE 875-125 MG PO TABS: 1.0000 | ORAL_TABLET | Freq: Two times a day (BID) | ORAL | 0 refills | Status: DC

## 2018-10-09 MED ORDER — AMOXICILLIN-POT CLAVULANATE 875-125 MG PO TABS: 1.0000 | ORAL_TABLET | Freq: Two times a day (BID) | ORAL | 0 refills | Status: AC

## 2018-10-09 NOTE — Patient Instructions (Signed)
Sinusitis, Adult -Take medication as prescribed. -Ibuprofen or Tylenol for pain, fever, or general discomfort. -Increase fluids. -Get plenty of rest. -Sleep elevated on at least 2 pillows at bedtime to help with cough if it develops. -Use a humidifier or vaporizer when at home and during sleep. -May use a teaspoon of honey or over-the-counter cough drops to help with sore throat. -May use normal saline nasal spray to help with nasal congestion throughout the day. -Cool compresses to the eyes to help with discomfort. -Do not scratch or rub the eyes. -Avoid triggers such as pollen, mold or dust. -Follow-up if symptoms do not improve.    Sinusitis is inflammation of your sinuses. Sinuses are hollow spaces in the bones around your face. Your sinuses are located:  Around your eyes.  In the middle of your forehead.  Behind your nose.  In your cheekbones. Mucus normally drains out of your sinuses. When your nasal tissues become inflamed or swollen, mucus can become trapped or blocked. This allows bacteria, viruses, and fungi to grow, which leads to infection. Most infections of the sinuses are caused by a virus. Sinusitis can develop quickly. It can last for up to 4 weeks (acute) or for more than 12 weeks (chronic). Sinusitis often develops after a cold. What are the causes? This condition is caused by anything that creates swelling in the sinuses or stops mucus from draining. This includes:  Allergies.  Asthma.  Infection from bacteria or viruses.  Deformities or blockages in your nose or sinuses.  Abnormal growths in the nose (nasal polyps).  Pollutants, such as chemicals or irritants in the air.  Infection from fungi (rare). What increases the risk? You are more likely to develop this condition if you:  Have a weak body defense system (immune system).  Do a lot of swimming or diving.  Overuse nasal sprays.  Smoke. What are the signs or symptoms? The main symptoms of  this condition are pain and a feeling of pressure around the affected sinuses. Other symptoms include:  Stuffy nose or congestion.  Thick drainage from your nose.  Swelling and warmth over the affected sinuses.  Headache.  Upper toothache.  A cough that may get worse at night.  Extra mucus that collects in the throat or the back of the nose (postnasal drip).  Decreased sense of smell and taste.  Fatigue.  A fever.  Sore throat.  Bad breath. How is this diagnosed? This condition is diagnosed based on:  Your symptoms.  Your medical history.  A physical exam.  Tests to find out if your condition is acute or chronic. This may include: ? Checking your nose for nasal polyps. ? Viewing your sinuses using a device that has a light (endoscope). ? Testing for allergies or bacteria. ? Imaging tests, such as an MRI or CT scan. In rare cases, a bone biopsy may be done to rule out more serious types of fungal sinus disease. How is this treated? Treatment for sinusitis depends on the cause and whether your condition is chronic or acute.  If caused by a virus, your symptoms should go away on their own within 10 days. You may be given medicines to relieve symptoms. They include: ? Medicines that shrink swollen nasal passages (topical intranasal decongestants). ? Medicines that treat allergies (antihistamines). ? A spray that eases inflammation of the nostrils (topical intranasal corticosteroids). ? Rinses that help get rid of thick mucus in your nose (nasal saline washes).  If caused by bacteria, your health  care provider may recommend waiting to see if your symptoms improve. Most bacterial infections will get better without antibiotic medicine. You may be given antibiotics if you have: ? A severe infection. ? A weak immune system.  If caused by narrow nasal passages or nasal polyps, you may need to have surgery. Follow these instructions at home: Medicines  Take, use, or  apply over-the-counter and prescription medicines only as told by your health care provider. These may include nasal sprays.  If you were prescribed an antibiotic medicine, take it as told by your health care provider. Do not stop taking the antibiotic even if you start to feel better. Hydrate and humidify   Drink enough fluid to keep your urine pale yellow. Staying hydrated will help to thin your mucus.  Use a cool mist humidifier to keep the humidity level in your home above 50%.  Inhale steam for 10-15 minutes, 3-4 times a day, or as told by your health care provider. You can do this in the bathroom while a hot shower is running.  Limit your exposure to cool or dry air. Rest  Rest as much as possible.  Sleep with your head raised (elevated).  Make sure you get enough sleep each night. General instructions   Apply a warm, moist washcloth to your face 3-4 times a day or as told by your health care provider. This will help with discomfort.  Wash your hands often with soap and water to reduce your exposure to germs. If soap and water are not available, use hand sanitizer.  Do not smoke. Avoid being around people who are smoking (secondhand smoke).  Keep all follow-up visits as told by your health care provider. This is important. Contact a health care provider if:  You have a fever.  Your symptoms get worse.  Your symptoms do not improve within 10 days. Get help right away if:  You have a severe headache.  You have persistent vomiting.  You have severe pain or swelling around your face or eyes.  You have vision problems.  You develop confusion.  Your neck is stiff.  You have trouble breathing. Summary  Sinusitis is soreness and inflammation of your sinuses. Sinuses are hollow spaces in the bones around your face.  This condition is caused by nasal tissues that become inflamed or swollen. The swelling traps or blocks the flow of mucus. This allows bacteria,  viruses, and fungi to grow, which leads to infection.  If you were prescribed an antibiotic medicine, take it as told by your health care provider. Do not stop taking the antibiotic even if you start to feel better.  Keep all follow-up visits as told by your health care provider. This is important. This information is not intended to replace advice given to you by your health care provider. Make sure you discuss any questions you have with your health care provider. Document Released: 07/13/2005 Document Revised: 12/13/2017 Document Reviewed: 12/13/2017 Elsevier Interactive Patient Education  2019 [employer].  Allergic Conjunctivitis, Adult  Allergic conjunctivitis is inflammation of the clear membrane that covers the white part of your eye and the inner surface of your eyelid (conjunctiva). The inflammation is caused by allergies. The blood vessels in the conjunctiva become inflamed and this causes the eyes to become red or pink. The eyes often feel itchy. Allergic conjunctivitis cannot be spread from one person to another person (is not contagious). What are the causes? This condition is caused by an allergic reaction. Common causes of  an allergic reaction (allergens) include:  Outdoor allergens, such as: ? Pollen. ? Grass and weeds. ? Mold spores.  Indoor allergens, such as: ? Dust. ? Smoke. ? Mold. ? Pet dander. ? Animal hair. What increases the risk? You may be more likely to develop this condition if you have a family history of allergies, such as:  Allergic rhinitis.  Bronchial asthma.  Atopic dermatitis. What are the signs or symptoms? Symptoms of this condition include eyes that are:  Itchy.  Red.  Watery.  Puffy. Your eyes may also:  Sting or burn.  Have clear drainage coming from them. How is this diagnosed? This condition may be diagnosed by medical history and physical exam. If you have drainage from your eyes, it may be tested to rule out other  causes of conjunctivitis. You may also need to see a health care provider who specializes in treating allergies (allergist) or eye conditions (ophthalmologist) for tests to confirm the diagnosis. You may have:  Skin tests to see which allergens are causing your symptoms. These tests involve pricking the skin with a tiny needle and exposing the skin to small amounts of potential allergens to see if your skin reacts.  Blood tests.  Tissue scrapings from your eyelid. These will be examined under a microscope. How is this treated? Treatments for this condition may include:  Cold cloths (compresses) to soothe itching and swelling.  Washing the face to remove allergens.  Eye drops. These may be prescription or over-the-counter. There are several different types. You may need to try different types to see which one works best for you. Your may need: ? Eye drops that block the allergic reaction (antihistamine). ? Eye drops that reduce swelling and irritation (anti-inflammatory). ? Steroid eye drops to lessen a severe reaction (vernal conjunctivitis).  Oral antihistamine medicines to reduce your allergic reaction. You may need these if eye drops do not help or are difficult to use. Follow these instructions at home:  Avoid known allergens whenever possible.  Take or apply over-the-counter and prescription medicines only as told by your health care provider. These include any eye drops.  Apply a cool, clean washcloth to your eye for 10-20 minutes, 3-4 times a day.  Do not touch or rub your eyes.  Do not wear contact lenses until the inflammation is gone. Wear glasses instead.  Do not wear eye makeup until the inflammation is gone.  Keep all follow-up visits as told by your health care provider. This is important. Contact a health care provider if:  Your symptoms get worse or do not improve with treatment.  You have mild eye pain.  You have sensitivity to light.  You have spots or  blisters on your eyes.  You have pus draining from your eye.  You have a fever. Get help right away if:  You have redness, swelling, or other symptoms in only one eye.  Your vision is blurred or you have vision changes.  You have severe eye pain. This information is not intended to replace advice given to you by your health care provider. Make sure you discuss any questions you have with your health care provider. Document Released: 10/03/2002 Document Revised: 03/11/2016 Document Reviewed: 01/24/2016 Elsevier Interactive Patient Education  2019 [employer].

## 2018-10-09 NOTE — Progress Notes (Signed)
MRN: [id number] DOB: September 27, 1955  Subjective:   Hannah Malone is a 63 y.o. female presenting for chief complaint of allergies and eye irration .  Reports a 3 week history of sinus headache, sinus congestion , rhinorrhea, itchy watery eyes, red eyes and sore throat, fatigue, purulent nasal drainage and facial fullness. Has tried OTC sinus medication, honey  for relief. Denies fever, ear fullness, ear drainage, difficulty swallowing, pain with swallowing, inability to swallow, dry cough, productive cough, wheezing, shortness of breath, chest tightness and chest pain, malaise, nausea, vomiting, abdominal pain and diarrhea. Has not had sick contact with influenza or strep or COVID-19. . Admits to a history of seasonal allergies, denies history of asthma. Patient has had a flu shot this season. Denies smoking, denies alcohol. Denies recent travel. Denies any other aggravating or relieving factors, no other questions or concerns.  Review of Systems  Constitutional: Positive for malaise/fatigue. Negative for chills and fever.  HENT: Positive for congestion, sinus pain and sore throat. Negative for ear discharge and ear pain.        + postnasal drip  Eyes: Positive for discharge ( milky to clear discharge, both eyes) and redness. Negative for blurred vision, double vision, photophobia and pain.  Respiratory: Negative.   Cardiovascular: Negative.   Gastrointestinal: Negative.   Skin: Negative.   Neurological: Negative.  Negative for tremors, focal weakness, weakness and headaches.  Endo/Heme/Allergies: Positive for environmental allergies.    Hannah Malone has a current medication list which includes the following prescription(s): aspirin ec and multivitamin-iron-minerals-folic acid, and the following Facility-Administered Medications: sodium chloride. Also has No Known Allergies.  Hannah Malone  has no past medical history on file. Also  has a past surgical history that includes Tonsillectomy.   Objective:    Vitals: BP 122/78   Pulse 97   Temp 98.1 F (36.7 C)   Resp 14   Wt 199 lb (90.3 kg)   SpO2 98%   BMI 31.17 kg/m   Physical Exam Vitals signs reviewed.  Constitutional:      General: She is not in acute distress. HENT:     Head: Normocephalic.     Right Ear: Tympanic membrane, ear canal and external ear normal.     Left Ear: Tympanic membrane, ear canal and external ear normal.     Nose: Mucosal edema, congestion (moderate) and rhinorrhea (clear nasal drainage) present.     Right Turbinates: Enlarged and swollen.     Left Turbinates: Enlarged and swollen.     Right Sinus: No maxillary sinus tenderness or frontal sinus tenderness.     Left Sinus: No maxillary sinus tenderness or frontal sinus tenderness.     Mouth/Throat:     Lips: Pink.     Mouth: Mucous membranes are moist.     Pharynx: Uvula midline. Posterior oropharyngeal erythema present. No pharyngeal swelling, oropharyngeal exudate or uvula swelling.     Tonsils: No tonsillar exudate. Swelling: 0 on the right. 0 on the left.  Eyes:     General: Lids are normal. Lids are everted, no foreign bodies appreciated. Vision grossly intact. Gaze aligned appropriately. Allergic shiner ( mild) present. No scleral icterus.    Extraocular Movements: Extraocular movements intact.     Conjunctiva/sclera:     Right eye: Right conjunctiva is injected. No chemosis, exudate or hemorrhage.    Left eye: Left conjunctiva is injected. No chemosis, exudate or hemorrhage.    Pupils: Pupils are equal, round, and reactive to light.     Comments: Conjunctiva  erythematous, injected bilaterally, no drainage at present  Neck:     Musculoskeletal: Normal range of motion and neck supple.  Cardiovascular:     Rate and Rhythm: Normal rate and regular rhythm.     Pulses: Normal pulses.     Heart sounds: Normal heart sounds.  Pulmonary:     Effort: Pulmonary effort is normal. No respiratory distress.     Breath sounds: Normal breath sounds. No  stridor. No wheezing, rhonchi or rales.  Abdominal:     General: Bowel sounds are normal.     Palpations: Abdomen is soft.     Tenderness: There is no abdominal tenderness.  Lymphadenopathy:     Cervical: No cervical adenopathy.  Skin:    General: Skin is warm and dry.     Capillary Refill: Capillary refill takes less than 2 seconds.  Neurological:     General: No focal deficit present.     Mental Status: She is alert and oriented to person, place, and time.     Cranial Nerves: No cranial nerve deficit.  Psychiatric:        Mood and Affect: Mood normal.        Behavior: Behavior normal.     Assessment and Plan :   Exam findings, diagnosis etiology and medication use and indications reviewed with patient. Follow- Up and discharge instructions provided. No emergent/urgent issues found on exam. Based on the patient's extended duration of symptoms and no improvement of symptoms with symptomatic relief, I feel patient has developed an acute bacterial sinusitis which stemmed from her allergies.  Patient also has allergic conjunctivitis based on the conjunctival injection, itching and discharge she presents with today.  The patient has nasal congestion/obstruction, purulent nasal discharge, headache, facial congestion/fullness. The patient does not display any cranial nerve deficits or abnormal extraocular movements that may require imaging at this time.  The patient is well-appearing, is fatigued, but in no acute distress and vital signs are stable.  Patient education was provided. Patient verbalized understanding of information provided and agrees with plan of care (POC), all questions answered. The patient is advised to call or return to clinic if condition does not see an improvement in symptoms, or to seek the care of the closest emergency department if condition worsens with the above plan.   1. Allergic conjunctivitis, bilateral  - Olopatadine HCl (PATADAY) 0.2 % SOLN; Apply 1 drop to eye  daily for 10 days. Apply 1 drop to both eyes daily for 10 days.  Dispense: 2.5 mL; Refill: 0 -Take medication as prescribed. -Ibuprofen or Tylenol for pain, fever, or general discomfort. -Increase fluids. -Get plenty of rest. -Cool compresses to the eyes to help with discomfort. -Do not scratch or rub the eyes. -Avoid triggers such as pollen, mold or dust. -Follow-up if symptoms do not improve.   2. Acute bacterial sinusitis  - fluticasone (FLONASE) 50 MCG/ACT nasal spray; Place 2 sprays into both nostrils daily for 10 days.  Dispense: 16 g; Refill: 0 - cetirizine (ZYRTEC) 10 MG tablet; Take 1 tablet (10 mg total) by mouth daily for 30 days.  Dispense: 30 tablet; Refill: 0 -Take medication as prescribed. -Ibuprofen or Tylenol for pain, fever, or general discomfort. -Increase fluids. -Get plenty of rest. -Sleep elevated on at least 2 pillows at bedtime to help with cough if it develops. -Use a humidifier or vaporizer when at home and during sleep. -May use a teaspoon of honey or over-the-counter cough drops to help with sore throat. -May use  normal saline nasal spray to help with nasal congestion throughout the day. -Avoid triggers such as pollen, mold or dust. -Follow-up if symptoms do not improve.

## 2019-03-17 ENCOUNTER — Other Ambulatory Visit: Payer: Self-pay

## 2019-03-17 ENCOUNTER — Ambulatory Visit (INDEPENDENT_AMBULATORY_CARE_PROVIDER_SITE_OTHER): Payer: PRIVATE HEALTH INSURANCE | Admitting: Family Medicine

## 2019-03-17 ENCOUNTER — Encounter: Payer: Self-pay | Admitting: Family Medicine

## 2019-03-17 VITALS — BP 138/76 | HR 90 | Temp 98.4°F | Resp 14 | Ht 67.0 in | Wt 204.0 lb

## 2019-03-17 DIAGNOSIS — E6609 Other obesity due to excess calories: Secondary | ICD-10-CM

## 2019-03-17 DIAGNOSIS — Z0000 Encounter for general adult medical examination without abnormal findings: Secondary | ICD-10-CM

## 2019-03-17 DIAGNOSIS — Z6830 Body mass index (BMI) 30.0-30.9, adult: Secondary | ICD-10-CM

## 2019-03-17 DIAGNOSIS — Z124 Encounter for screening for malignant neoplasm of cervix: Secondary | ICD-10-CM | POA: Diagnosis not present

## 2019-03-17 DIAGNOSIS — Z0001 Encounter for general adult medical examination with abnormal findings: Secondary | ICD-10-CM

## 2019-03-17 MED ORDER — SHINGRIX 50 MCG/0.5ML IM SUSR: 0.5000 mL | Freq: Once | INTRAMUSCULAR | 1 refills | Status: AC

## 2019-03-17 NOTE — Patient Instructions (Signed)
Shingles vaccine sent to pharmacy  We will call with lab results F/U 1 year for physical

## 2019-03-17 NOTE — Addendum Note (Signed)
Addended by: Sheral Flow on: 03/17/2019 09:48 AM   Modules accepted: Orders

## 2019-03-17 NOTE — Progress Notes (Signed)
   Subjective:    Patient ID: Guy Franco, female    DOB: [birth year], 63 y.o.   MRN: [id number]  Patient presents for Gynecologic Exam (is fasting)  Here for complete physical exam.  Medications and history reviewed Due for fasting labs  Mammogram- UTD Dec 2019  Pap smear- Due for PAP  Colonoscopy up-to-date done in December 2018  Immunizations tetanus pneumonia vaccine up-to-date , discussed shingles vaccine     Review Of Systems:  GEN- denies fatigue, fever, weight loss,weakness, recent illness HEENT- denies eye drainage, change in vision, nasal discharge, CVS- denies chest pain, palpitations RESP- denies SOB, cough, wheeze ABD- denies N/V, change in stools, abd pain GU- denies dysuria, hematuria, dribbling, incontinence MSK- denies joint pain, muscle aches, injury Neuro- denies headache, dizziness, syncope, seizure activity       Objective:    BP 138/76   Pulse 90   Temp 98.4 F (36.9 C) (Oral)   Resp 14   Ht 5\' 7"  (1.702 m)   Wt 204 lb (92.5 kg)   SpO2 95%   BMI 31.95 kg/m  GEN- NAD, alert and oriented x3 HEENT- PERRL, EOMI, non injected sclera, pink conjunctiva, MMM, oropharynx clear, mild wax in canals bilat  Neck- supple, no thyromegaly Breast- normal symmetry, no nipple inversion,no nipple drainage, no nodules or lumps felt Anxillary CVS- RRR, no murmur RESP-CTAB GU- normal external genitalia, vaginal mucosa pink and moist, cervix visualized no growth, no blood form os, No discharge, no CMT, no ovarian masses, uterus normal size ABD-NABS,soft,NT,ND EXT- No edema Pulses- Radial, DP- 2+  Audit C/FALL/Depression screen negative       Assessment & Plan:      Problem List Items Addressed This Visit      Unprioritized   Obesity    Other Visit Diagnoses    Routine general medical examination at a health care facility    -  Primary   CPE done, PAP Today, fasting labs, work on dietary changes, shingrix to pharmacy , flu shot to be done at work   Relevant Orders   CBC with Differential/Platelet   Comprehensive metabolic panel   Lipid panel   Cervical cancer screening       Relevant Orders   Pap IG w/ reflex to HPV when ASC-U      Note: This dictation was prepared with Dragon dictation along with smaller phrase technology. Any transcriptional errors that result from this process are unintentional.

## 2019-03-18 LAB — LIPID PANEL
Cholesterol: 139 mg/dL (ref <200)
HDL: 53 mg/dL (ref >=50)
LDL Cholesterol (Calc): 71 mg/dL (calc)
Non-HDL Cholesterol (Calc): 86 mg/dL (calc) (ref <130)
Total CHOL/HDL Ratio: 2.6 (calc) (ref <5.0)
Triglycerides: 70 mg/dL (ref <150)

## 2019-03-18 LAB — COMPREHENSIVE METABOLIC PANEL
AG Ratio: 1.5 (calc) (ref 1.0–2.5)
ALT: 17 U/L (ref 6–29)
AST: 17 U/L (ref 10–35)
Albumin: 4 g/dL (ref 3.6–5.1)
Alkaline phosphatase (APISO): 57 U/L (ref 37–153)
BUN: 13 mg/dL (ref 7–25)
CO2: 27 mmol/L (ref 20–32)
Calcium: 8.9 mg/dL (ref 8.6–10.4)
Chloride: 105 mmol/L (ref 98–110)
Creat: 0.84 mg/dL (ref 0.50–0.99)
Globulin: 2.7 g/dL (calc) (ref 1.9–3.7)
Glucose, Bld: 95 mg/dL (ref 65–99)
Potassium: 4 mmol/L (ref 3.5–5.3)
Sodium: 140 mmol/L (ref 135–146)
Total Bilirubin: 0.3 mg/dL (ref 0.2–1.2)
Total Protein: 6.7 g/dL (ref 6.1–8.1)

## 2019-03-18 LAB — CBC WITH DIFFERENTIAL/PLATELET
Absolute Monocytes: 382 cells/uL (ref 200–950)
Basophils Absolute: 50 cells/uL (ref 0–200)
Basophils Relative: 0.6 %
Eosinophils Absolute: 191 cells/uL (ref 15–500)
Eosinophils Relative: 2.3 %
HCT: 37.9 % (ref 35.0–45.0)
Hemoglobin: 13 g/dL (ref 11.7–15.5)
Lymphs Abs: 2390 cells/uL (ref 850–3900)
MCH: 28.3 pg (ref 27.0–33.0)
MCHC: 34.3 g/dL (ref 32.0–36.0)
MCV: 82.4 fL (ref 80.0–100.0)
MPV: 11.8 fL (ref 7.5–12.5)
Monocytes Relative: 4.6 %
Neutro Abs: 5287 cells/uL (ref 1500–7800)
Neutrophils Relative %: 63.7 %
Platelets: 282 10*3/uL (ref 140–400)
RBC: 4.6 10*6/uL (ref 3.80–5.10)
RDW: 15.2 % — ABNORMAL HIGH (ref 11.0–15.0)
Total Lymphocyte: 28.8 %
WBC: 8.3 10*3/uL (ref 3.8–10.8)

## 2019-03-20 LAB — PAP IG W/ RFLX HPV ASCU

## 2019-03-21 ENCOUNTER — Encounter: Payer: Self-pay | Admitting: *Deleted

## 2019-07-06 ENCOUNTER — Encounter: Payer: Self-pay | Admitting: *Deleted

## 2019-07-24 ENCOUNTER — Other Ambulatory Visit (HOSPITAL_COMMUNITY): Payer: Self-pay | Admitting: Family Medicine

## 2019-07-24 DIAGNOSIS — Z1231 Encounter for screening mammogram for malignant neoplasm of breast: Secondary | ICD-10-CM

## 2019-07-26 ENCOUNTER — Ambulatory Visit (HOSPITAL_COMMUNITY)
Admission: RE | Admit: 2019-07-26 | Discharge: 2019-07-26 | Disposition: A | Discharge status: Home / Self Care | Payer: PRIVATE HEALTH INSURANCE | Source: Ambulatory Visit | Attending: Family Medicine | Admitting: Family Medicine

## 2019-07-26 ENCOUNTER — Other Ambulatory Visit: Payer: Self-pay

## 2019-07-26 DIAGNOSIS — Z1231 Encounter for screening mammogram for malignant neoplasm of breast: Secondary | ICD-10-CM | POA: Diagnosis not present

## 2019-09-28 ENCOUNTER — Ambulatory Visit: Payer: PRIVATE HEALTH INSURANCE | Attending: Internal Medicine

## 2019-09-28 DIAGNOSIS — Z23 Encounter for immunization: Secondary | ICD-10-CM

## 2019-09-28 NOTE — Progress Notes (Signed)
   Covid-19 Vaccination Clinic  Name:  Hannah Malone    MRN: [id number] DOB: 20-Nov-1955  09/28/2019  Hannah Malone was observed post Covid-19 immunization for 15 minutes without incident. She was provided with Vaccine Information Sheet and instruction to access the V-Safe system.   Hannah Malone was instructed to call 911 with any severe reactions post vaccine: Marland Kitchen Difficulty breathing  . Swelling of face and throat  . A fast heartbeat  . A bad rash all over body  . Dizziness and weakness   Immunizations Administered    Name Date Dose VIS Date Route   Moderna COVID-19 Vaccine 09/28/2019  8:22 AM 0.5 mL 06/27/2019 Intramuscular   Manufacturer: Moderna   Lot: [id number]   NDC: 62824-175-30

## 2019-10-11 ENCOUNTER — Telehealth (INDEPENDENT_AMBULATORY_CARE_PROVIDER_SITE_OTHER): Payer: PRIVATE HEALTH INSURANCE | Admitting: Nurse Practitioner

## 2019-10-11 DIAGNOSIS — J0110 Acute frontal sinusitis, unspecified: Secondary | ICD-10-CM

## 2019-10-11 MED ORDER — AMOXICILLIN-POT CLAVULANATE 875-125 MG PO TABS: 1.0000 | ORAL_TABLET | Freq: Two times a day (BID) | ORAL | 0 refills | Status: DC

## 2019-10-11 MED ORDER — LORATADINE 10 MG PO TABS: 10.0000 mg | ORAL_TABLET | Freq: Every day | ORAL | 0 refills | Status: AC

## 2019-10-11 MED ORDER — FLUTICASONE PROPIONATE 50 MCG/ACT NA SUSP: 2.0000 | Freq: Every day | NASAL | 6 refills | Status: AC

## 2019-10-11 NOTE — Progress Notes (Signed)
Virtual Visit via Mychart Video Note   I connected with Hannah Malone. Knopf on 10/11/2019 at 1115 by Mychart Videoand verified that I am speaking with the correct person using two identifiers.  Pt location: at home   Physician location:  In office, [facility], Lawson Fiscal, FNP-C    On call: patient and physician   I discussed the limitations, risks, security and privacy concerns of performing an evaluation and management service by telephone and the availability of in person appointments. I also discussed with the patient that there may be a patient responsible charge related to this service. The patient expressed understanding and agreed to proceed.  History of Present Illness: Pt is a 64 year old A.A. female who has sxs of nasal congestion, nasal drainage that started 8 days ago. Over the past 3 days she has sxs of frontal sinus pressure. She has tried advil sinus with minimal decrease in sxs. She denied contact with COVID positive persons and or sick contacts. She reports h/o seasonal allergies with similar sxs. In the past. Denies any fever/ chills/ bodyaches/ no vomiting or diarrhea, loss of taste/smell, or cough.   Past Surgical History:  Procedure Laterality Date  . TONSILLECTOMY      Family History  Problem Relation Age of Onset  . Arthritis Mother   . Hyperlipidemia Mother   . Hypertension Mother   . Vision loss Mother        Glaucoma  . Hyperlipidemia Father   . Arthritis Maternal Grandfather   . Birth defects Sister   . Early death Sister   . Cancer Maternal Aunt        lung cancer   . Colon cancer Neg Hx   . Colon polyps Neg Hx   . Rectal cancer Neg Hx   . Stomach cancer Neg Hx     Social History   Socioeconomic History  . Marital status: Single    Spouse name: Not on file  . Number of children: Not on file  . Years of education: Not on file  . Highest education level: Not on file  Occupational History  . Not on file  Tobacco Use  .  Smoking status: Never Smoker  . Smokeless tobacco: Never Used  Substance and Sexual Activity  . Alcohol use: Yes    Comment: occ  . Drug use: No  . Sexual activity: Not Currently  Other Topics Concern  . Not on file  Social History Narrative  . Not on file   Social Determinants of Health   Financial Resource Strain:   . Difficulty of Paying Living Expenses:   Food Insecurity:   . Worried About Programme researcher, broadcasting/film/video in the Last Year:   . Barista in the Last Year:   Transportation Needs:   . Freight forwarder (Medical):   Marland Kitchen Lack of Transportation (Non-Medical):   Physical Activity:   . Days of Exercise per Week:   . Minutes of Exercise per Session:   Stress:   . Feeling of Stress :   Social Connections:   . Frequency of Communication with Friends and Family:   . Frequency of Social Gatherings with Friends and Family:   . Attends Religious Services:   . Active Member of Clubs or Organizations:   . Attends Banker Meetings:   Marland Kitchen Marital Status:   Intimate Partner Violence:   . Fear of Current or Ex-Partner:   . Emotionally Abused:   Marland Kitchen Physically  Abused:   . Sexually Abused:     Outpatient Medications Prior to Visit  Medication Sig Dispense Refill  . aspirin EC 81 MG tablet Take 81 mg by mouth daily.    . multivitamin-iron-minerals-folic acid (CENTRUM) chewable tablet Chew 1 tablet by mouth daily.     Facility-Administered Medications Prior to Visit  Medication Dose Route Frequency Provider Last Rate Last Admin  . 0.9 %  sodium chloride infusion  500 mL Intravenous Continuous Nandigam, Kavitha V, MD      Observations/Objective: NAD noted, able to speak in full sentences but very congested sounding, harsh cough, no wheeze heard   Assessment and Plan: Your symptoms are consistent with sinusitis. I have called in prescriptions for Claritin, Flonase, and Augmentin. Complete treatment, if sxs persist, become worse seek medical attention.   Follow  Up Instructions:  I discussed the assessment and treatment plan with the patient. The patient was provided an opportunity to ask questions and all were answered. The patient agreed with the plan and demonstrated an understanding of the instructions.  The patient was advised to call back or seek an in-person evaluation if the symptoms worsen or if the condition fails to improve as anticipated.  I provided 15 minutes of non-face-to-face time during this encounter. End Time [place], FNP-C    No Known Allergies

## 2019-10-31 ENCOUNTER — Ambulatory Visit: Payer: PRIVATE HEALTH INSURANCE | Attending: Internal Medicine

## 2019-10-31 DIAGNOSIS — Z23 Encounter for immunization: Secondary | ICD-10-CM

## 2019-10-31 NOTE — Progress Notes (Signed)
   Covid-19 Vaccination Clinic  Name:  Hannah Malone    MRN: [id number] DOB: [birth year]  10/31/2019  Ms. Osias was observed post Covid-19 immunization for 15 minutes without incident. She was provided with Vaccine Information Sheet and instruction to access the V-Safe system.   Ms. Holohan was instructed to call 911 with any severe reactions post vaccine: Marland Kitchen Difficulty breathing  . Swelling of face and throat  . A fast heartbeat  . A bad rash all over body  . Dizziness and weakness   Immunizations Administered    Name Date Dose VIS Date Route   Moderna COVID-19 Vaccine 10/31/2019  8:43 AM 0.5 mL 06/27/2019 Intramuscular   Manufacturer: Moderna   Lot: [id number]   NDC: 32346-887-37

## 2020-06-25 ENCOUNTER — Ambulatory Visit (INDEPENDENT_AMBULATORY_CARE_PROVIDER_SITE_OTHER): Payer: PRIVATE HEALTH INSURANCE | Admitting: Family Medicine

## 2020-06-25 ENCOUNTER — Other Ambulatory Visit: Payer: Self-pay

## 2020-06-25 VITALS — BP 120/90 | HR 79 | Temp 98.0°F | Ht 67.0 in | Wt 210.0 lb

## 2020-06-25 DIAGNOSIS — E049 Nontoxic goiter, unspecified: Secondary | ICD-10-CM

## 2020-06-25 DIAGNOSIS — Z0001 Encounter for general adult medical examination with abnormal findings: Secondary | ICD-10-CM | POA: Diagnosis not present

## 2020-06-25 DIAGNOSIS — Z0000 Encounter for general adult medical examination without abnormal findings: Secondary | ICD-10-CM

## 2020-06-25 NOTE — Progress Notes (Signed)
Subjective:    Patient ID: Hannah Malone, female    DOB: January 02, 1956, 64 y.o.   MRN: [id number]  HPI Patient is a very pleasant 64 year old African-American female here today for complete physical exam.  She denies any concerns.  I reviewed her shot record is listed below: Immunization History  Administered Date(s) Administered  . Influenza-Unspecified 05/01/1996, 05/22/1997, 04/29/2016  . Moderna SARS-COVID-2 Vaccination 09/28/2019, 10/31/2019  . Pneumococcal Polysaccharide-23 05/01/1996  . Tdap 06/08/2016   She is also had her flu shot at an outside clinic.  She is due for the shingles vaccine and I encouraged her to consider this.  Regarding her cancer screening she had a colonoscopy in 2018 that was normal.  She is not due again until 2028.  She is due for mammogram at the end of December.  She schedules this on her own.  Her last Pap smear was performed last year in 2020 and was normal.  She is not due again until 2023.  She denies any family history of osteoporosis and therefore she is not due for a bone density test until 67.  She is taking about 1000 mg of calcium a day but she is not taking any vitamin D. No past medical history on file.  Past Surgical History:  Procedure Laterality Date  . TONSILLECTOMY     Current Outpatient Medications on File Prior to Visit  Medication Sig Dispense Refill  . aspirin EC 81 MG tablet Take 81 mg by mouth daily.    . fluticasone (FLONASE) 50 MCG/ACT nasal spray Place 2 sprays into both nostrils daily. 16 g 6  . loratadine (CLARITIN) 10 MG tablet Take 1 tablet (10 mg total) by mouth daily. 30 tablet 0  . multivitamin-iron-minerals-folic acid (CENTRUM) chewable tablet Chew 1 tablet by mouth daily.     Current Facility-Administered Medications on File Prior to Visit  Medication Dose Route Frequency Provider Last Rate Last Admin  . 0.9 %  sodium chloride infusion  500 mL Intravenous Continuous Nandigam, Eleonore Chiquito, MD       No Known  Allergies Social History   Socioeconomic History  . Marital status: Single    Spouse name: Not on file  . Number of children: Not on file  . Years of education: Not on file  . Highest education level: Not on file  Occupational History  . Not on file  Tobacco Use  . Smoking status: Never Smoker  . Smokeless tobacco: Never Used  Vaping Use  . Vaping Use: Never used  Substance and Sexual Activity  . Alcohol use: Yes    Comment: occ  . Drug use: No  . Sexual activity: Not Currently  Other Topics Concern  . Not on file  Social History Narrative  . Not on file   Social Determinants of Health   Financial Resource Strain:   . Difficulty of Paying Living Expenses: Not on file  Food Insecurity:   . Worried About Programme researcher, broadcasting/film/video in the Last Year: Not on file  . Ran Out of Food in the Last Year: Not on file  Transportation Needs:   . Lack of Transportation (Medical): Not on file  . Lack of Transportation (Non-Medical): Not on file  Physical Activity:   . Days of Exercise per Week: Not on file  . Minutes of Exercise per Session: Not on file  Stress:   . Feeling of Stress : Not on file  Social Connections:   . Frequency of Communication with Friends  and Family: Not on file  . Frequency of Social Gatherings with Friends and Family: Not on file  . Attends Religious Services: Not on file  . Active Member of Clubs or Organizations: Not on file  . Attends Banker Meetings: Not on file  . Marital Status: Not on file  Intimate Partner Violence:   . Fear of Current or Ex-Partner: Not on file  . Emotionally Abused: Not on file  . Physically Abused: Not on file  . Sexually Abused: Not on file   Family History  Problem Relation Age of Onset  . Arthritis Mother   . Hyperlipidemia Mother   . Hypertension Mother   . Vision loss Mother        Glaucoma  . Hyperlipidemia Father   . Arthritis Maternal Grandfather   . Birth defects Sister   . Early death Sister   .  Cancer Maternal Aunt        lung cancer   . Colon cancer Neg Hx   . Colon polyps Neg Hx   . Rectal cancer Neg Hx   . Stomach cancer Neg Hx      Review of Systems     Objective:   Physical Exam Vitals reviewed.  Constitutional:      General: She is not in acute distress.    Appearance: Normal appearance. She is not ill-appearing, toxic-appearing or diaphoretic.  HENT:     Head: Normocephalic and atraumatic.     Right Ear: Tympanic membrane and ear canal normal. There is no impacted cerumen.     Left Ear: Tympanic membrane and ear canal normal. There is no impacted cerumen.     Nose: Nose normal. No congestion or rhinorrhea.     Mouth/Throat:     Mouth: Mucous membranes are moist.     Pharynx: Oropharynx is clear. No oropharyngeal exudate or posterior oropharyngeal erythema.  Eyes:     General: No scleral icterus.       Right eye: No discharge.        Left eye: No discharge.     Extraocular Movements: Extraocular movements intact.     Conjunctiva/sclera: Conjunctivae normal.     Pupils: Pupils are equal, round, and reactive to light.  Neck:     Thyroid: Thyromegaly present.     Vascular: No carotid bruit.  Cardiovascular:     Rate and Rhythm: Normal rate and regular rhythm.     Pulses: Normal pulses.     Heart sounds: Normal heart sounds. No murmur heard.  No friction rub. No gallop.   Pulmonary:     Effort: Pulmonary effort is normal. No respiratory distress.     Breath sounds: Normal breath sounds. No wheezing, rhonchi or rales.  Chest:     Chest wall: No tenderness.  Abdominal:     General: Bowel sounds are normal. There is no distension.     Palpations: Abdomen is soft. There is no mass.     Tenderness: There is no abdominal tenderness. There is no guarding or rebound.  Musculoskeletal:        General: No tenderness, deformity or signs of injury.     Cervical back: Normal range of motion and neck supple. No rigidity or tenderness.     Right lower leg: No edema.      Left lower leg: No edema.  Lymphadenopathy:     Cervical: No cervical adenopathy.  Skin:    General: Skin is warm.     Coloration: Skin  is not jaundiced.     Findings: No bruising, erythema, lesion or rash.  Neurological:     General: No focal deficit present.     Mental Status: She is alert and oriented to person, place, and time. Mental status is at baseline.     Cranial Nerves: No cranial nerve deficit.     Sensory: No sensory deficit.     Motor: No weakness.     Coordination: Coordination normal.     Gait: Gait normal.     Deep Tendon Reflexes: Reflexes normal.  Psychiatric:        Mood and Affect: Mood normal.        Behavior: Behavior normal.        Thought Content: Thought content normal.        Judgment: Judgment normal.           Assessment & Plan:  General medical exam - Plan: CBC with Differential/Platelet, COMPLETE METABOLIC PANEL WITH GFR, Lipid panel  Goiter - Plan: TSH  I recommended that she start taking at 1000 units a day of vitamin D.  Colonoscopy and Pap smear up-to-date.  She will schedule her mammogram.  I encouraged her to get a shingles vaccine when available.  The remainder of her immunizations are up-to-date.  Her physical exam is significant only for a small goiter.  I do not appreciate any nodularity but her thyroid gland does appear to be enlarged.  Therefore I will check a TSH.  The remainder of her preventative care is up-to-date.  Regular anticipatory guidance is provided.

## 2020-06-26 LAB — COMPLETE METABOLIC PANEL WITH GFR
AG Ratio: 1.5 (calc) (ref 1.0–2.5)
ALT: 20 U/L (ref 6–29)
AST: 18 U/L (ref 10–35)
Albumin: 4.1 g/dL (ref 3.6–5.1)
Alkaline phosphatase (APISO): 63 U/L (ref 37–153)
BUN: 10 mg/dL (ref 7–25)
CO2: 28 mmol/L (ref 20–32)
Calcium: 9.5 mg/dL (ref 8.6–10.4)
Chloride: 105 mmol/L (ref 98–110)
Creat: 0.86 mg/dL (ref 0.50–0.99)
GFR, Est African American: 84 mL/min/{1.73_m2} (ref >=60)
GFR, Est Non African American: 72 mL/min/{1.73_m2} (ref >=60)
Globulin: 2.8 g/dL (calc) (ref 1.9–3.7)
Glucose, Bld: 103 mg/dL — ABNORMAL HIGH (ref 65–99)
Potassium: 4.6 mmol/L (ref 3.5–5.3)
Sodium: 142 mmol/L (ref 135–146)
Total Bilirubin: 0.3 mg/dL (ref 0.2–1.2)
Total Protein: 6.9 g/dL (ref 6.1–8.1)

## 2020-06-26 LAB — LIPID PANEL
Cholesterol: 150 mg/dL (ref <200)
HDL: 54 mg/dL (ref >=50)
LDL Cholesterol (Calc): 78 mg/dL (calc)
Non-HDL Cholesterol (Calc): 96 mg/dL (calc) (ref <130)
Total CHOL/HDL Ratio: 2.8 (calc) (ref <5.0)
Triglycerides: 96 mg/dL (ref <150)

## 2020-06-26 LAB — CBC WITH DIFFERENTIAL/PLATELET
Absolute Monocytes: 423 cells/uL (ref 200–950)
Basophils Absolute: 50 cells/uL (ref 0–200)
Basophils Relative: 0.6 %
Eosinophils Absolute: 390 cells/uL (ref 15–500)
Eosinophils Relative: 4.7 %
HCT: 40.3 % (ref 35.0–45.0)
Hemoglobin: 13.3 g/dL (ref 11.7–15.5)
Lymphs Abs: 2897 cells/uL (ref 850–3900)
MCH: 27.7 pg (ref 27.0–33.0)
MCHC: 33 g/dL (ref 32.0–36.0)
MCV: 84 fL (ref 80.0–100.0)
MPV: 12.1 fL (ref 7.5–12.5)
Monocytes Relative: 5.1 %
Neutro Abs: 4540 cells/uL (ref 1500–7800)
Neutrophils Relative %: 54.7 %
Platelets: 277 10*3/uL (ref 140–400)
RBC: 4.8 10*6/uL (ref 3.80–5.10)
RDW: 14.8 % (ref 11.0–15.0)
Total Lymphocyte: 34.9 %
WBC: 8.3 10*3/uL (ref 3.8–10.8)

## 2020-06-26 LAB — TSH: TSH: 3.16 mIU/L (ref 0.40–4.50)

## 2020-07-05 ENCOUNTER — Other Ambulatory Visit (HOSPITAL_COMMUNITY): Payer: Self-pay | Admitting: Family Medicine

## 2020-07-05 DIAGNOSIS — Z1231 Encounter for screening mammogram for malignant neoplasm of breast: Secondary | ICD-10-CM

## 2020-07-12 ENCOUNTER — Telehealth: Payer: Self-pay

## 2020-07-12 DIAGNOSIS — E559 Vitamin D deficiency, unspecified: Secondary | ICD-10-CM

## 2020-07-12 MED ORDER — CALCIUM CARBONATE-VITAMIN D 300-100 MG-UNIT PO CAPS: 1000.0000 mg/d | ORAL_CAPSULE | Freq: Every day | ORAL | 3 refills | Status: AC

## 2020-07-12 NOTE — Telephone Encounter (Signed)
Patient left vm for Vitamin D 3, rx was sent to patient pharmacy.

## 2020-07-17 ENCOUNTER — Telehealth: Payer: Self-pay

## 2020-07-17 ENCOUNTER — Other Ambulatory Visit: Payer: Self-pay

## 2020-07-17 NOTE — Telephone Encounter (Signed)
Patient called about prescription at pharmacy that she needed to pick up

## 2020-08-05 ENCOUNTER — Other Ambulatory Visit: Payer: Self-pay

## 2020-08-05 ENCOUNTER — Ambulatory Visit (HOSPITAL_COMMUNITY)
Admission: RE | Admit: 2020-08-05 | Discharge: 2020-08-05 | Disposition: A | Discharge status: Home / Self Care | Payer: PRIVATE HEALTH INSURANCE | Source: Ambulatory Visit | Attending: Family Medicine | Admitting: Family Medicine

## 2020-08-05 DIAGNOSIS — Z1231 Encounter for screening mammogram for malignant neoplasm of breast: Secondary | ICD-10-CM | POA: Diagnosis present

## 2021-05-13 ENCOUNTER — Other Ambulatory Visit (HOSPITAL_COMMUNITY): Payer: Self-pay

## 2021-05-13 MED ORDER — INFLUENZA VAC SPLIT QUAD 0.5 ML IM SUSY
PREFILLED_SYRINGE | INTRAMUSCULAR | 0 refills | Status: DC
  Filled 2021-05-13: qty 0.5, 1d supply, fill #0

## 2021-06-27 ENCOUNTER — Encounter: Payer: PRIVATE HEALTH INSURANCE | Admitting: Family Medicine

## 2021-07-07 ENCOUNTER — Encounter: Payer: Self-pay | Admitting: Family Medicine

## 2021-07-07 ENCOUNTER — Other Ambulatory Visit: Payer: Self-pay

## 2021-07-07 ENCOUNTER — Ambulatory Visit (INDEPENDENT_AMBULATORY_CARE_PROVIDER_SITE_OTHER): Payer: PRIVATE HEALTH INSURANCE | Admitting: Family Medicine

## 2021-07-07 VITALS — BP 128/92 | HR 79 | Resp 18 | Ht 67.0 in | Wt 197.0 lb

## 2021-07-07 DIAGNOSIS — Z124 Encounter for screening for malignant neoplasm of cervix: Secondary | ICD-10-CM | POA: Diagnosis not present

## 2021-07-07 DIAGNOSIS — E049 Nontoxic goiter, unspecified: Secondary | ICD-10-CM | POA: Diagnosis not present

## 2021-07-07 DIAGNOSIS — Z0000 Encounter for general adult medical examination without abnormal findings: Secondary | ICD-10-CM

## 2021-07-07 NOTE — Progress Notes (Signed)
Subjective:    Patient ID: Hannah Malone, female    DOB: [birth year], 65 y.o.   MRN: [id number]  HPI Patient is a very pleasant 65 year old African-American female here today for complete physical exam.  I reviewed her shot record is listed below: Immunization History  Administered Date(s) Administered   Influenza,inj,Quad PF,6+ Mos 05/13/2021   Influenza-Unspecified 05/01/1996, 05/22/1997, 04/29/2016   Moderna Sars-Covid-2 Vaccination 09/28/2019, 10/31/2019   Pneumococcal Polysaccharide-23 05/01/1996   Tdap 06/08/2016   She had a colonoscopy in 2018 that was normal.  She is not due again until 2028.    She denies any family history of osteoporosis and therefore she is not due for a bone density test until 67.  She is due for pap smear.  She is due for the shingles vaccine.  She is due for the by Valent COVID booster.  On her physical exam today she does have a goiter.  I do not appreciate any nodularity but the thyroid gland is enlarged No past medical history on file.  Past Surgical History:  Procedure Laterality Date   TONSILLECTOMY     Current Outpatient Medications on File Prior to Visit  Medication Sig Dispense Refill   aspirin EC 81 MG tablet Take 81 mg by mouth daily.     Calcium Carbonate-Vitamin D 300-100 MG-UNIT CAPS Take 1,000 mg/day by mouth daily. 90 capsule 3   fluticasone (FLONASE) 50 MCG/ACT nasal spray Place 2 sprays into both nostrils daily. 16 g 6   influenza vac split quadrivalent PF (FLUARIX) 0.5 ML injection Inject into the muscle. 0.5 mL 0   loratadine (CLARITIN) 10 MG tablet Take 1 tablet (10 mg total) by mouth daily. 30 tablet 0   multivitamin-iron-minerals-folic acid (CENTRUM) chewable tablet Chew 1 tablet by mouth daily.     Current Facility-Administered Medications on File Prior to Visit  Medication Dose Route Frequency Provider Last Rate Last Admin   0.9 %  sodium chloride infusion  500 mL Intravenous Continuous Nandigam, Eleonore Chiquito, MD       No Known  Allergies Social History   Socioeconomic History   Marital status: Single    Spouse name: Not on file   Number of children: Not on file   Years of education: Not on file   Highest education level: Not on file  Occupational History   Not on file  Tobacco Use   Smoking status: Never   Smokeless tobacco: Never  Vaping Use   Vaping Use: Never used  Substance and Sexual Activity   Alcohol use: Yes    Comment: occ   Drug use: No   Sexual activity: Not Currently  Other Topics Concern   Not on file  Social History Narrative   Not on file   Social Determinants of Health   Financial Resource Strain: Not on file  Food Insecurity: Not on file  Transportation Needs: Not on file  Physical Activity: Not on file  Stress: Not on file  Social Connections: Not on file  Intimate Partner Violence: Not on file   Family History  Problem Relation Age of Onset   Arthritis Mother    Hyperlipidemia Mother    Hypertension Mother    Vision loss Mother        Glaucoma   Hyperlipidemia Father    Arthritis Maternal Grandfather    Birth defects Sister    Early death Sister    Cancer Maternal Aunt        lung cancer  Colon cancer Neg Hx    Colon polyps Neg Hx    Rectal cancer Neg Hx    Stomach cancer Neg Hx      Review of Systems     Objective:   Physical Exam Vitals reviewed. Exam conducted with a chaperone present.  Constitutional:      General: She is not in acute distress.    Appearance: Normal appearance. She is not ill-appearing, toxic-appearing or diaphoretic.  HENT:     Head: Normocephalic and atraumatic.     Right Ear: Tympanic membrane and ear canal normal. There is no impacted cerumen.     Left Ear: Tympanic membrane and ear canal normal. There is no impacted cerumen.     Nose: Nose normal. No congestion or rhinorrhea.     Mouth/Throat:     Mouth: Mucous membranes are moist.     Pharynx: Oropharynx is clear. No oropharyngeal exudate or posterior oropharyngeal  erythema.  Eyes:     General: No scleral icterus.       Right eye: No discharge.        Left eye: No discharge.     Extraocular Movements: Extraocular movements intact.     Conjunctiva/sclera: Conjunctivae normal.     Pupils: Pupils are equal, round, and reactive to light.  Neck:     Thyroid: Thyromegaly present.     Vascular: No carotid bruit.  Cardiovascular:     Rate and Rhythm: Normal rate and regular rhythm.     Pulses: Normal pulses.     Heart sounds: Normal heart sounds. No murmur heard.   No friction rub. No gallop.  Pulmonary:     Effort: Pulmonary effort is normal. No respiratory distress.     Breath sounds: Normal breath sounds. No wheezing, rhonchi or rales.  Chest:     Chest wall: No tenderness.  Abdominal:     General: Bowel sounds are normal. There is no distension.     Palpations: Abdomen is soft. There is no mass.     Tenderness: There is no abdominal tenderness. There is no guarding or rebound.     Hernia: There is no hernia in the left inguinal area or right inguinal area.  Genitourinary:    Exam position: Lithotomy position.     Vagina: Normal.     Cervix: No cervical motion tenderness, discharge, friability, lesion, erythema or cervical bleeding.     Uterus: Normal.      Adnexa: Right adnexa normal and left adnexa normal.  Musculoskeletal:        General: No tenderness, deformity or signs of injury.     Cervical back: Normal range of motion and neck supple. No rigidity or tenderness.     Right lower leg: No edema.     Left lower leg: No edema.  Lymphadenopathy:     Cervical: No cervical adenopathy.     Lower Body: No right inguinal adenopathy. No left inguinal adenopathy.  Skin:    General: Skin is warm.     Coloration: Skin is not jaundiced.     Findings: No bruising, erythema, lesion or rash.  Neurological:     General: No focal deficit present.     Mental Status: She is alert and oriented to person, place, and time. Mental status is at baseline.      Cranial Nerves: No cranial nerve deficit.     Sensory: No sensory deficit.     Motor: No weakness.     Coordination: Coordination normal.  Gait: Gait normal.     Deep Tendon Reflexes: Reflexes normal.  Psychiatric:        Mood and Affect: Mood normal.        Behavior: Behavior normal.        Thought Content: Thought content normal.        Judgment: Judgment normal.          Assessment & Plan:  General medical exam - Plan: CBC with Differential/Platelet, Lipid panel, COMPLETE METABOLIC PANEL WITH GFR  Cervical cancer screening - Plan: PAP, Thin Prep w/HPV rflx HPV Type 16/18  Goiter - Plan: US THYROID, TSH  Pap smear was performed today and sent to pathology and labeled container.  Mammogram is due in January.  Colonoscopy is up-to-date.  Not yet due for a bone density test.  Based on the painless goiter I will obtain a thyroid ultrasound to evaluate further and check a TSH.  Also check CBC CMP and a lipid panel.  Immunizations are up-to-date including her flu shot.  The only shots that she is due for is the shingles vaccine as well as the bioavailable COVID booster which I recommended to the patient today.

## 2021-07-08 LAB — CBC WITH DIFFERENTIAL/PLATELET
Absolute Monocytes: 500 cells/uL (ref 200–950)
Basophils Absolute: 57 cells/uL (ref 0–200)
Basophils Relative: 0.7 %
Eosinophils Absolute: 328 cells/uL (ref 15–500)
Eosinophils Relative: 4 %
HCT: 41.8 % (ref 35.0–45.0)
Hemoglobin: 14.2 g/dL (ref 11.7–15.5)
Lymphs Abs: 2657 cells/uL (ref 850–3900)
MCH: 28.7 pg (ref 27.0–33.0)
MCHC: 34 g/dL (ref 32.0–36.0)
MCV: 84.4 fL (ref 80.0–100.0)
MPV: 12.4 fL (ref 7.5–12.5)
Monocytes Relative: 6.1 %
Neutro Abs: 4658 cells/uL (ref 1500–7800)
Neutrophils Relative %: 56.8 %
Platelets: 261 10*3/uL (ref 140–400)
RBC: 4.95 10*6/uL (ref 3.80–5.10)
RDW: 14.3 % (ref 11.0–15.0)
Total Lymphocyte: 32.4 %
WBC: 8.2 10*3/uL (ref 3.8–10.8)

## 2021-07-08 LAB — COMPLETE METABOLIC PANEL WITH GFR
AG Ratio: 1.4 (calc) (ref 1.0–2.5)
ALT: 16 U/L (ref 6–29)
AST: 15 U/L (ref 10–35)
Albumin: 4 g/dL (ref 3.6–5.1)
Alkaline phosphatase (APISO): 54 U/L (ref 37–153)
BUN: 15 mg/dL (ref 7–25)
CO2: 29 mmol/L (ref 20–32)
Calcium: 9.2 mg/dL (ref 8.6–10.4)
Chloride: 105 mmol/L (ref 98–110)
Creat: 0.79 mg/dL (ref 0.50–1.05)
Globulin: 2.8 g/dL (calc) (ref 1.9–3.7)
Glucose, Bld: 100 mg/dL — ABNORMAL HIGH (ref 65–99)
Potassium: 4.1 mmol/L (ref 3.5–5.3)
Sodium: 142 mmol/L (ref 135–146)
Total Bilirubin: 0.3 mg/dL (ref 0.2–1.2)
Total Protein: 6.8 g/dL (ref 6.1–8.1)
eGFR: 84 mL/min/{1.73_m2} (ref >=60)

## 2021-07-08 LAB — LIPID PANEL
Cholesterol: 152 mg/dL
HDL: 63 mg/dL
LDL Cholesterol (Calc): 74 mg/dL
Non-HDL Cholesterol (Calc): 89 mg/dL
Total CHOL/HDL Ratio: 2.4 (calc)
Triglycerides: 72 mg/dL

## 2021-07-08 LAB — TSH: TSH: 3.94 m[IU]/L (ref 0.40–4.50)

## 2021-07-09 LAB — PAP, TP IMAGING W/ HPV RNA, RFLX HPV TYPE 16,18/45: HPV DNA High Risk: NOT DETECTED

## 2021-07-24 ENCOUNTER — Other Ambulatory Visit: Payer: Self-pay

## 2021-07-24 ENCOUNTER — Ambulatory Visit (HOSPITAL_COMMUNITY)
Admission: RE | Admit: 2021-07-24 | Discharge: 2021-07-24 | Disposition: A | Discharge status: Home / Self Care | Payer: No Typology Code available for payment source | Source: Ambulatory Visit | Attending: Family Medicine | Admitting: Family Medicine

## 2021-07-24 DIAGNOSIS — E049 Nontoxic goiter, unspecified: Secondary | ICD-10-CM | POA: Insufficient documentation

## 2022-01-13 ENCOUNTER — Telehealth: Payer: Self-pay

## 2022-01-13 NOTE — Telephone Encounter (Signed)
Pt called stated that pt had rec' a letter from her employment insurance requesting for proof that pt in fact had an annual/physical exam done for 2022.  Advice pt to fax me the letter, and I can attached it notes as proof and will fax it back to insurance. Per pt will fax letter

## 2022-01-19 ENCOUNTER — Telehealth: Payer: Self-pay

## 2022-01-19 NOTE — Telephone Encounter (Signed)
Pt called stated that pt had rec' a letter from her employment insurance requesting for proof that pt in fact had an annual/physical exam done for 2022.   Per pt chart, pt had General Annual Exam on 07/07/21. Pt was printed her OV notes and given it to her insurance as proof. Per pt's insurance they were never billed or received a statement for this OV. Per pt's insurance the [employer] [phone], must have coded wrong? Please advice?

## 2022-08-14 ENCOUNTER — Other Ambulatory Visit (HOSPITAL_COMMUNITY): Payer: Self-pay | Admitting: Adult Medicine

## 2022-08-14 DIAGNOSIS — Z1231 Encounter for screening mammogram for malignant neoplasm of breast: Secondary | ICD-10-CM

## 2022-08-19 ENCOUNTER — Ambulatory Visit (HOSPITAL_COMMUNITY)
Admission: RE | Admit: 2022-08-19 | Discharge: 2022-08-19 | Disposition: A | Discharge status: Home / Self Care | Payer: PRIVATE HEALTH INSURANCE | Source: Ambulatory Visit | Attending: Adult Medicine | Admitting: Adult Medicine

## 2022-08-19 DIAGNOSIS — Z1231 Encounter for screening mammogram for malignant neoplasm of breast: Secondary | ICD-10-CM | POA: Insufficient documentation

## 2022-09-22 IMAGING — MG DIGITAL SCREENING BILAT W/ TOMO W/ CAD
6 of 10 series · 6 of 30 positions shown · non-contrast
Comparison: Previous exam(s).

CLINICAL DATA: Screening.

EXAM:
DIGITAL SCREENING BILATERAL MAMMOGRAM WITH TOMO AND CAD

[R MLO synth-2D]
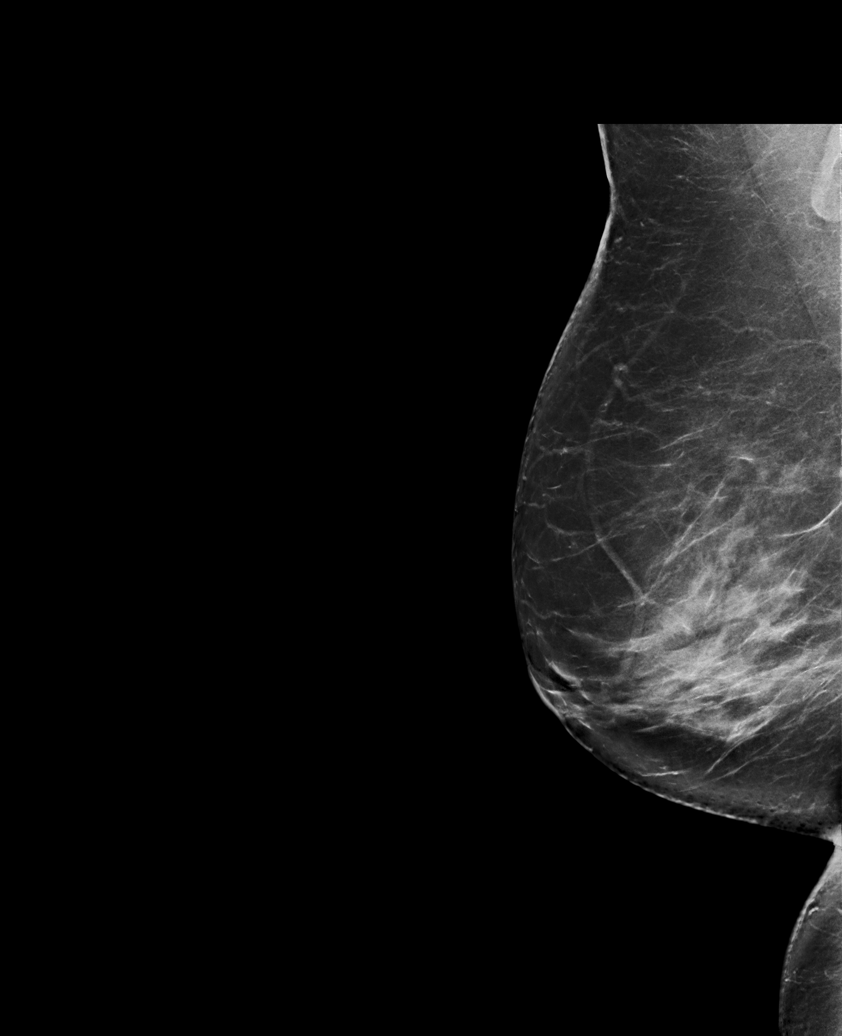

[R CC synth-2D (1 of 2)]
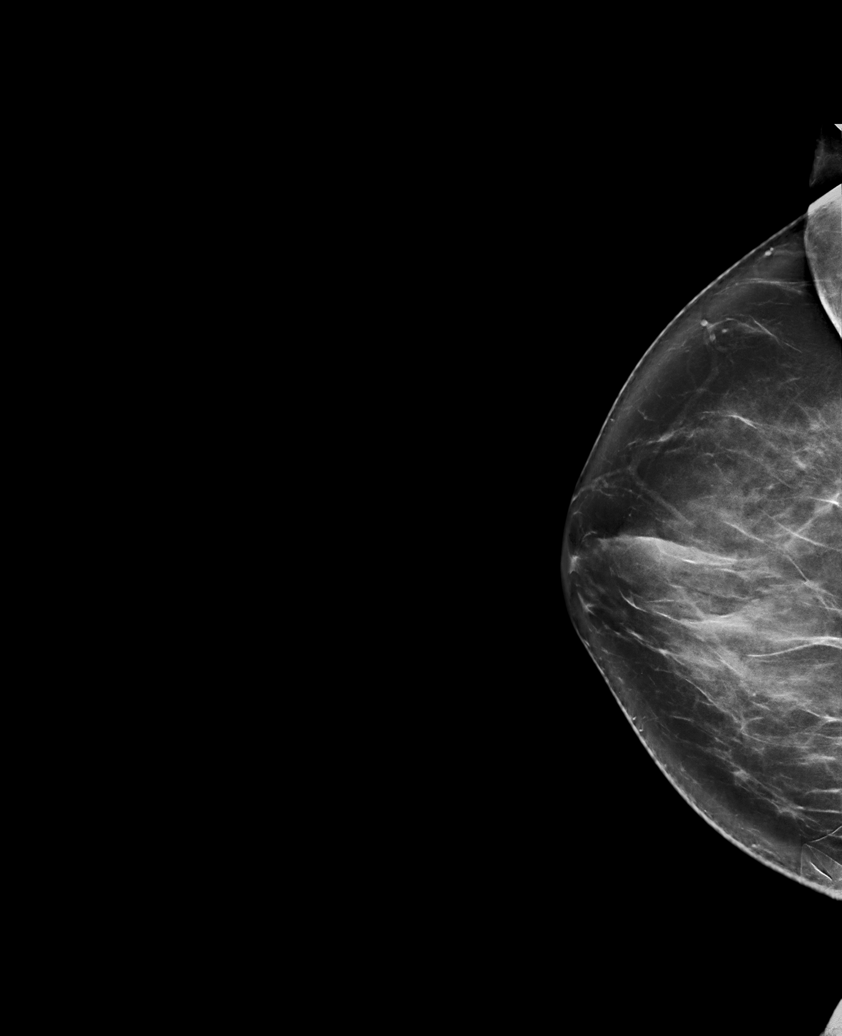

[L MLO synth-2D]
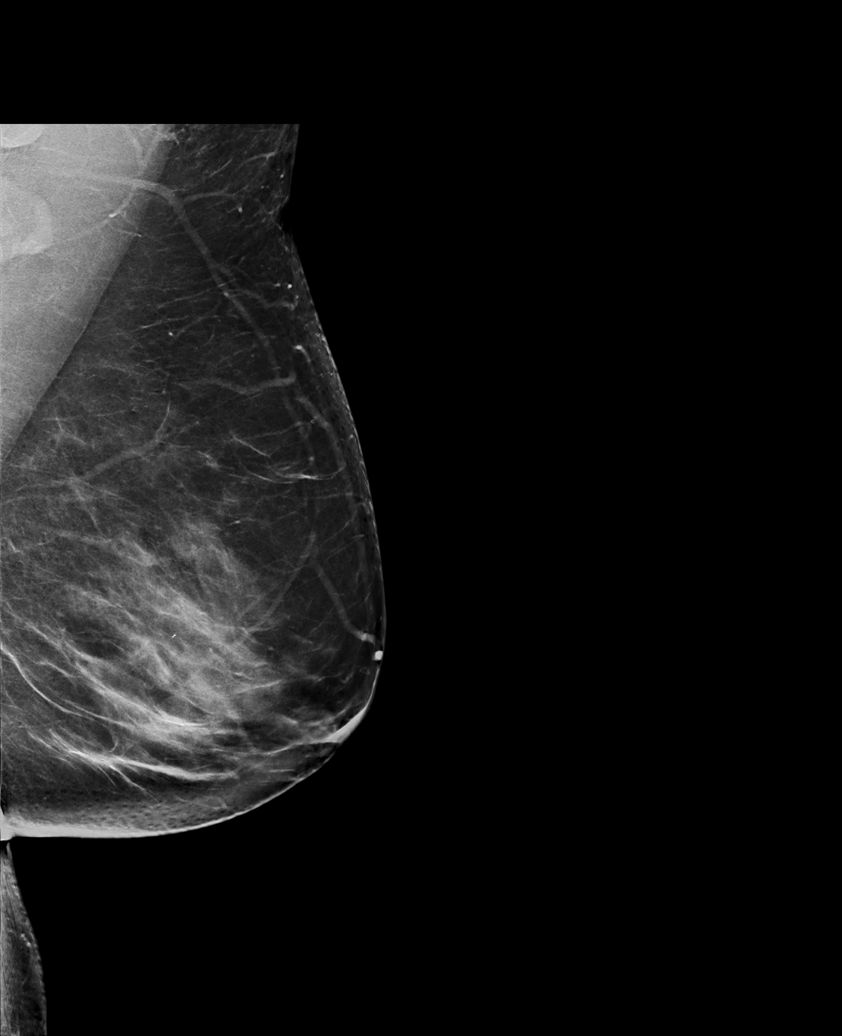

[R CC synth-2D (2 of 2)]
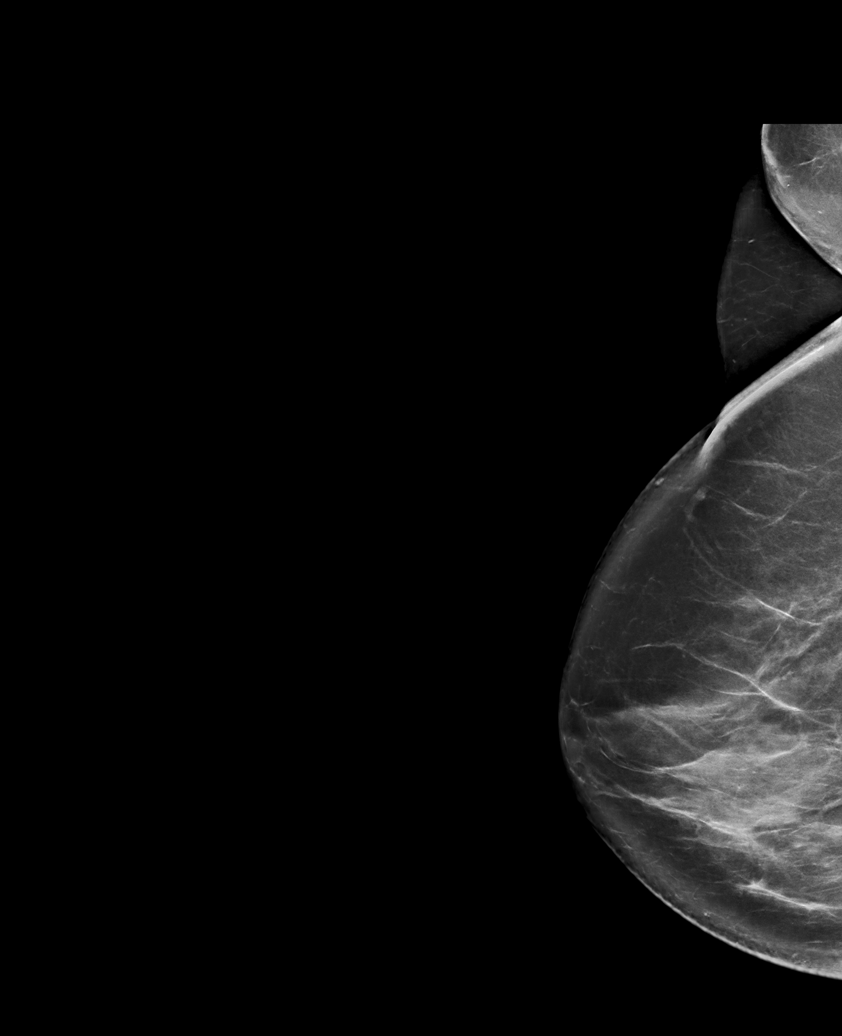

[L CC synth-2D]
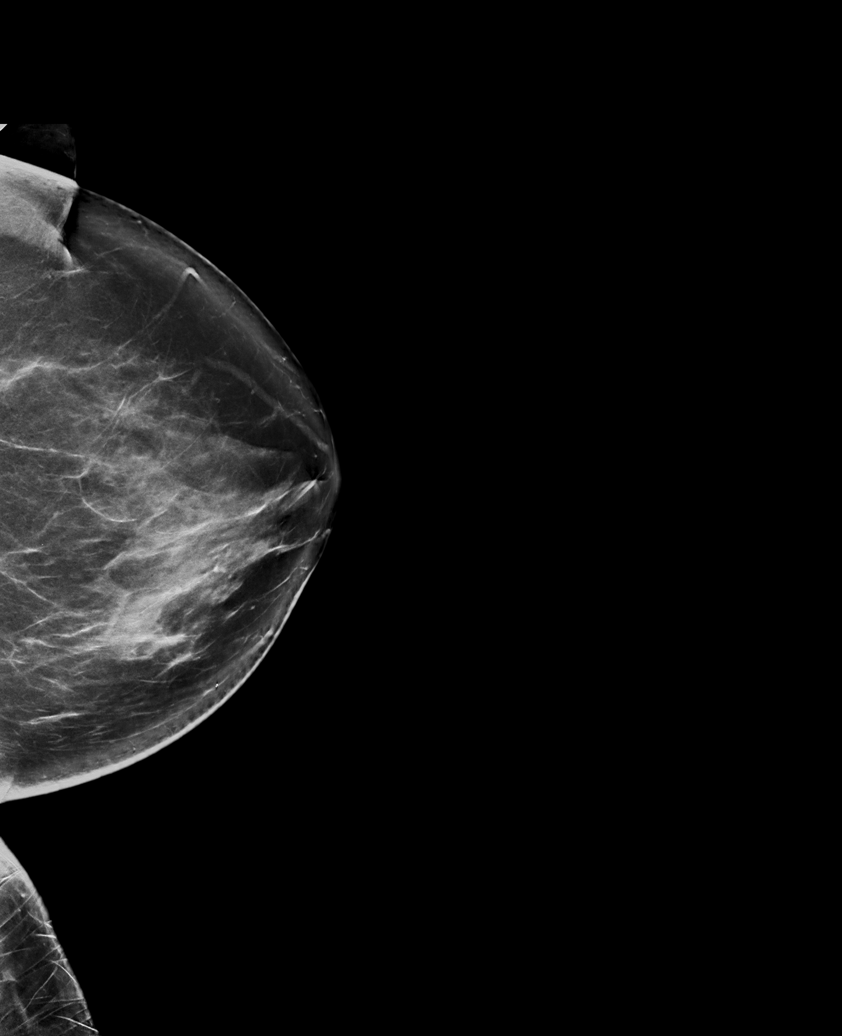

[L CC tomo · tomo slice 45/90.0]
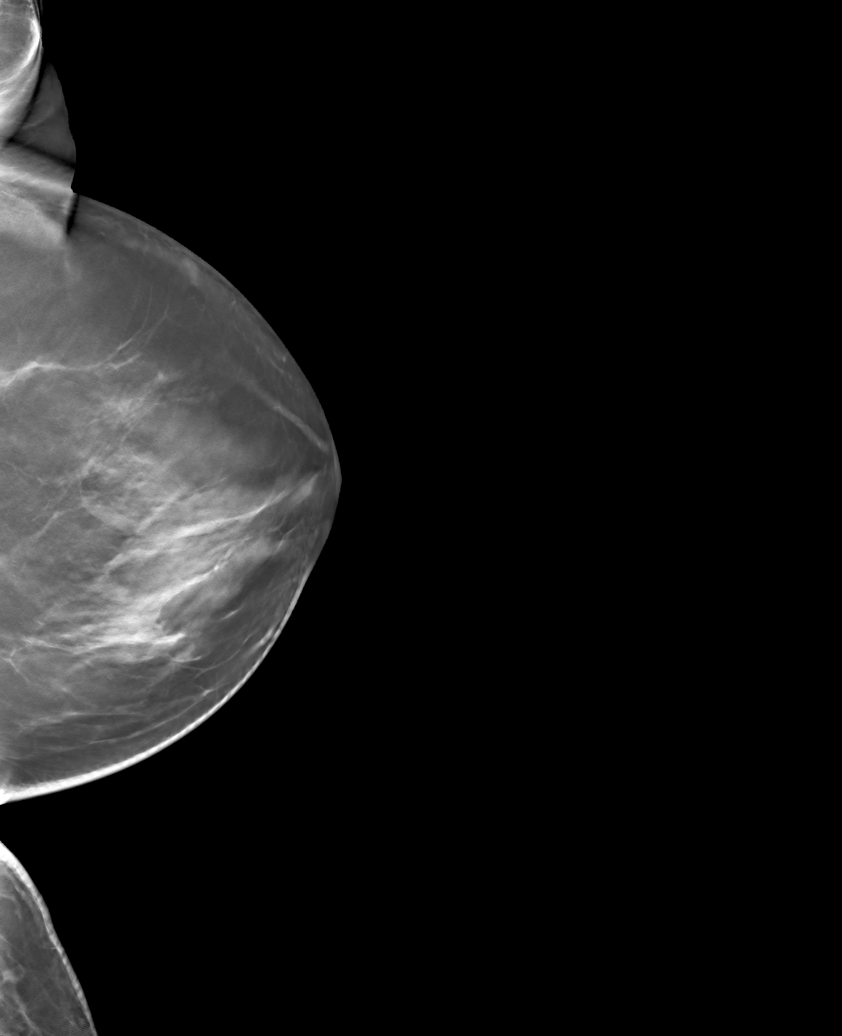

[6 of 30 positions shown; findings below may reference images not displayed]

ACR Breast Density Category c: The breast tissue is heterogeneously
dense, which may obscure small masses.
FINDINGS: There are no findings suspicious for malignancy. Images were
processed with CAD.
IMPRESSION: No mammographic evidence of malignancy. A result letter of this
screening mammogram will be mailed directly to the patient.

RECOMMENDATION:
Screening mammogram in one year. (Code:FT-U-LHB)

BI-RADS CATEGORY  1: Negative.

## 2023-02-19 ENCOUNTER — Ambulatory Visit: Payer: 59 | Admitting: Internal Medicine

## 2023-02-19 ENCOUNTER — Encounter: Payer: Self-pay | Admitting: Internal Medicine

## 2023-02-19 VITALS — BP 138/87 | HR 92 | Ht 67.0 in | Wt 194.6 lb

## 2023-02-19 DIAGNOSIS — Z780 Asymptomatic menopausal state: Secondary | ICD-10-CM

## 2023-02-19 DIAGNOSIS — Z13220 Encounter for screening for lipoid disorders: Secondary | ICD-10-CM

## 2023-02-19 DIAGNOSIS — Z13820 Encounter for screening for osteoporosis: Secondary | ICD-10-CM

## 2023-02-19 DIAGNOSIS — Z1321 Encounter for screening for nutritional disorder: Secondary | ICD-10-CM

## 2023-02-19 DIAGNOSIS — Z1329 Encounter for screening for other suspected endocrine disorder: Secondary | ICD-10-CM | POA: Diagnosis not present

## 2023-02-19 DIAGNOSIS — Z131 Encounter for screening for diabetes mellitus: Secondary | ICD-10-CM | POA: Diagnosis not present

## 2023-02-19 DIAGNOSIS — E6609 Other obesity due to excess calories: Secondary | ICD-10-CM

## 2023-02-19 DIAGNOSIS — Z6830 Body mass index (BMI) 30.0-30.9, adult: Secondary | ICD-10-CM

## 2023-02-19 NOTE — Patient Instructions (Signed)
It was a pleasure to see you today.  Thank you for giving [place] the opportunity to be involved in your care.  Below is a brief recap of your visit and next steps.  We will plan to see you again in 3 months.  Summary You have established care today We will check basic labs A bone density scan has been ordered Follow up in 3 months for physical

## 2023-02-19 NOTE — Assessment & Plan Note (Signed)
DEXA scan ordered today 

## 2023-02-19 NOTE — Progress Notes (Signed)
New Patient Office Visit  Subjective    Patient ID: Hannah Malone, female    DOB: [birth year]  Age: 67 y.o. MRN: [id number]  CC:  Chief Complaint  Patient presents with   Establish Care    HPI Hannah Malone presents to establish care.  She is a 67 year old woman with no significant past medical history.  Not taking any medications regularly.  Previously followed by Dr. Tanya Malone.  Hannah Malone reports feeling well today.  She is asymptomatic and has no acute concerns to discuss aside from desiring to establish care.  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  She denies tobacco, alcohol, and illicit drug use.  Her family medical history is significant for hypertension and skin cancer.  Chronic medical conditions and outstanding preventative care items discussed today are individually addressed A/P below.   Outpatient Encounter Medications as of 02/19/2023  Medication Sig   aspirin EC 81 MG tablet Take 81 mg by mouth daily.   Calcium Carbonate-Vitamin D 300-100 MG-UNIT CAPS Take 1,000 mg/day by mouth daily.   fluticasone (FLONASE) 50 MCG/ACT nasal spray Place 2 sprays into both nostrils daily.   loratadine (CLARITIN) 10 MG tablet Take 1 tablet (10 mg total) by mouth daily.   multivitamin-iron-minerals-folic acid (CENTRUM) chewable tablet Chew 1 tablet by mouth daily.   [DISCONTINUED] influenza vac split quadrivalent PF (FLUARIX) 0.5 ML injection Inject into the muscle.   Facility-Administered Encounter Medications as of 02/19/2023  Medication   0.9 %  sodium chloride infusion    History reviewed. No pertinent past medical history.  Past Surgical History:  Procedure Laterality Date   TONSILLECTOMY      Family History  Problem Relation Age of Onset   Arthritis Mother    Hyperlipidemia Mother    Hypertension Mother    Vision loss Mother        Glaucoma   Hyperlipidemia Father    Arthritis Maternal Grandfather    Birth defects Sister     Early death Sister    Cancer Maternal Aunt        lung cancer    Colon cancer Neg Hx    Colon polyps Neg Hx    Rectal cancer Neg Hx    Stomach cancer Neg Hx     Social History   Socioeconomic History   Marital status: Single    Spouse name: Not on file   Number of children: Not on file   Years of education: Not on file   Highest education level: Not on file  Occupational History   Not on file  Tobacco Use   Smoking status: Never   Smokeless tobacco: Never  Vaping Use   Vaping status: Never Used  Substance and Sexual Activity   Alcohol use: Yes    Comment: occ   Drug use: No   Sexual activity: Not Currently  Other Topics Concern   Not on file  Social History Narrative   Not on file   Social Determinants of Health   Financial Resource Strain: Not on file  Food Insecurity: Not on file  Transportation Needs: Not on file  Physical Activity: Not on file  Stress: Not on file  Social Connections: Not on file  Intimate Partner Violence: Not on file   Review of Systems  Constitutional:  Negative for chills and fever.  HENT:  Negative for sore throat.   Respiratory:  Negative for cough and shortness of breath.   Cardiovascular:  Negative for chest pain, palpitations and leg swelling.  Gastrointestinal:  Negative for abdominal pain, blood in stool, constipation, diarrhea, nausea and vomiting.  Genitourinary:  Negative for dysuria and hematuria.  Musculoskeletal:  Negative for myalgias.  Skin:  Negative for itching and rash.  Neurological:  Negative for dizziness and headaches.  Psychiatric/Behavioral:  Negative for depression and suicidal ideas.    Objective    BP 138/87   Pulse 92   Ht 5\' 7"  (1.702 m)   Wt 194 lb 9.6 oz (88.3 kg)   SpO2 90%   BMI 30.48 kg/m   Physical Exam Vitals reviewed.  Constitutional:      General: She is not in acute distress.    Appearance: Normal appearance. She is obese. She is not toxic-appearing.  HENT:     Head: Normocephalic  and atraumatic.     Right Ear: External ear normal.     Left Ear: External ear normal.     Nose: Nose normal. No congestion or rhinorrhea.     Mouth/Throat:     Mouth: Mucous membranes are moist.     Pharynx: Oropharynx is clear. No oropharyngeal exudate or posterior oropharyngeal erythema.  Eyes:     General: No scleral icterus.    Extraocular Movements: Extraocular movements intact.     Conjunctiva/sclera: Conjunctivae normal.     Pupils: Pupils are equal, round, and reactive to light.  Cardiovascular:     Rate and Rhythm: Normal rate and regular rhythm.     Pulses: Normal pulses.     Heart sounds: Normal heart sounds. No murmur heard.    No friction rub. No gallop.  Pulmonary:     Effort: Pulmonary effort is normal.     Breath sounds: Normal breath sounds. No wheezing, rhonchi or rales.  Abdominal:     General: Abdomen is flat. Bowel sounds are normal. There is no distension.     Palpations: Abdomen is soft.     Tenderness: There is no abdominal tenderness.  Musculoskeletal:        General: No swelling. Normal range of motion.     Cervical back: Normal range of motion.     Right lower leg: No edema.     Left lower leg: No edema.  Lymphadenopathy:     Cervical: No cervical adenopathy.  Skin:    General: Skin is warm and dry.     Capillary Refill: Capillary refill takes less than 2 seconds.     Coloration: Skin is not jaundiced.  Neurological:     General: No focal deficit present.     Mental Status: She is alert and oriented to person, place, and time.  Psychiatric:        Mood and Affect: Mood normal.        Behavior: Behavior normal.    Assessment & Plan:   Problem List Items Addressed This Visit       Obesity    Lifestyle modifications aimed at weight loss were reviewed.  Baseline labs have been ordered today.  She will return to care for her annual physical in 3 months.      Screening for osteoporosis    DEXA scan ordered today      Return in about 3  months (around 05/22/2023) for CPE.   Billie Lade, MD

## 2023-02-19 NOTE — Assessment & Plan Note (Signed)
Lifestyle modifications aimed at weight loss were reviewed.  Baseline labs have been ordered today.  She will return to care for her annual physical in 3 months.

## 2023-02-22 ENCOUNTER — Other Ambulatory Visit: Payer: Self-pay

## 2023-02-22 DIAGNOSIS — N289 Disorder of kidney and ureter, unspecified: Secondary | ICD-10-CM

## 2023-05-17 ENCOUNTER — Ambulatory Visit (INDEPENDENT_AMBULATORY_CARE_PROVIDER_SITE_OTHER): Payer: 59 | Admitting: Internal Medicine

## 2023-05-17 ENCOUNTER — Encounter: Payer: Self-pay | Admitting: Internal Medicine

## 2023-05-17 VITALS — BP 136/90 | HR 72 | Ht 67.0 in | Wt 194.6 lb

## 2023-05-17 DIAGNOSIS — Z23 Encounter for immunization: Secondary | ICD-10-CM | POA: Insufficient documentation

## 2023-05-17 DIAGNOSIS — Z0001 Encounter for general adult medical examination with abnormal findings: Secondary | ICD-10-CM | POA: Insufficient documentation

## 2023-05-17 DIAGNOSIS — R7989 Other specified abnormal findings of blood chemistry: Secondary | ICD-10-CM | POA: Insufficient documentation

## 2023-05-17 NOTE — Progress Notes (Signed)
Complete physical exam  Patient: Hannah Malone   DOB: 1956-07-05   67 y.o. Female  MRN: [id number]  Subjective:    Chief Complaint  Patient presents with   Establish Care    Needs annual physical.     Hannah Malone is a 67 y.o. female who presents today for a complete physical exam. She reports consuming a general diet. The patient does not participate in regular exercise at present. She generally feels well. She reports sleeping well. She does not have additional problems to discuss today.    Most recent fall risk assessment:    05/17/2023   11:16 AM  Fall Risk   Falls in the past year? 0  Number falls in past yr: 0  Injury with Fall? 0  Risk for fall due to : No Fall Risks  Follow up Falls evaluation completed     Most recent depression screenings:    05/17/2023   11:16 AM 02/19/2023    8:08 AM  PHQ 2/9 Scores  PHQ - 2 Score 0 0  PHQ- 9 Score  0    Vision:Within last year and Dental: No current dental problems and Receives regular dental care  History reviewed. No pertinent past medical history. Past Surgical History:  Procedure Laterality Date   TONSILECTOMY/ADENOIDECTOMY WITH MYRINGOTOMY Bilateral 1964   TONSILLECTOMY     Social History   Tobacco Use   Smoking status: Never   Smokeless tobacco: Never  Vaping Use   Vaping status: Never Used  Substance Use Topics   Alcohol use: Yes    Comment: occ   Drug use: No   Family History  Problem Relation Age of Onset   Arthritis Mother    Hyperlipidemia Mother    Hypertension Mother    Vision loss Mother        Glaucoma   Hyperlipidemia Father    Birth defects Sister    Early death Sister    Cancer Maternal Aunt        lung cancer    Arthritis Maternal Grandfather    Colon cancer Maternal Grandfather    Colon polyps Neg Hx    Rectal cancer Neg Hx    Stomach cancer Neg Hx    No Known Allergies    Patient Care Team: Billie Lade, MD as PCP - General (Internal Medicine)   Outpatient  Medications Prior to Visit  Medication Sig   aspirin EC 81 MG tablet Take 81 mg by mouth daily.   Calcium Carbonate-Vitamin D 300-100 MG-UNIT CAPS Take 1,000 mg/day by mouth daily.   fluticasone (FLONASE) 50 MCG/ACT nasal spray Place 2 sprays into both nostrils daily.   loratadine (CLARITIN) 10 MG tablet Take 1 tablet (10 mg total) by mouth daily.   multivitamin-iron-minerals-folic acid (CENTRUM) chewable tablet Chew 1 tablet by mouth daily.   [DISCONTINUED] 0.9 %  sodium chloride infusion    No facility-administered medications prior to visit.   Review of Systems  Constitutional:  Negative for chills and fever.  HENT:  Negative for sore throat.   Respiratory:  Negative for cough and shortness of breath.   Cardiovascular:  Negative for chest pain, palpitations and leg swelling.  Gastrointestinal:  Negative for abdominal pain, blood in stool, constipation, diarrhea, nausea and vomiting.  Genitourinary:  Negative for dysuria and hematuria.  Musculoskeletal:  Negative for myalgias.  Skin:  Negative for itching and rash.  Neurological:  Negative for dizziness and headaches.  Psychiatric/Behavioral:  Negative for depression and suicidal  ideas.       Objective:     BP (!) 136/90   Pulse 72   Ht 5\' 7"  (1.702 m)   Wt 194 lb 9.6 oz (88.3 kg)   SpO2 97%   BMI 30.48 kg/m  BP Readings from Last 3 Encounters:  05/17/23 (!) 136/90  02/19/23 138/87  07/07/21 (!) 128/92   Physical Exam Vitals reviewed.  Constitutional:      General: She is not in acute distress.    Appearance: Normal appearance. She is obese. She is not toxic-appearing.  HENT:     Head: Normocephalic and atraumatic.     Right Ear: External ear normal.     Left Ear: External ear normal.     Nose: Nose normal. No congestion or rhinorrhea.     Mouth/Throat:     Mouth: Mucous membranes are moist.     Pharynx: Oropharynx is clear. No oropharyngeal exudate or posterior oropharyngeal erythema.  Eyes:     General: No  scleral icterus.    Extraocular Movements: Extraocular movements intact.     Conjunctiva/sclera: Conjunctivae normal.     Pupils: Pupils are equal, round, and reactive to light.  Cardiovascular:     Rate and Rhythm: Normal rate and regular rhythm.     Pulses: Normal pulses.     Heart sounds: Normal heart sounds. No murmur heard.    No friction rub. No gallop.  Pulmonary:     Effort: Pulmonary effort is normal.     Breath sounds: Normal breath sounds. No wheezing, rhonchi or rales.  Abdominal:     General: Abdomen is flat. Bowel sounds are normal. There is no distension.     Palpations: Abdomen is soft.     Tenderness: There is no abdominal tenderness.  Musculoskeletal:        General: No swelling. Normal range of motion.     Cervical back: Normal range of motion.     Right lower leg: No edema.     Left lower leg: No edema.  Lymphadenopathy:     Cervical: No cervical adenopathy.  Skin:    General: Skin is warm and dry.     Capillary Refill: Capillary refill takes less than 2 seconds.     Coloration: Skin is not jaundiced.  Neurological:     General: No focal deficit present.     Mental Status: She is alert and oriented to person, place, and time.  Psychiatric:        Mood and Affect: Mood normal.        Behavior: Behavior normal.   Last CBC Lab Results  Component Value Date   WBC 10.0 02/19/2023   HGB 13.7 02/19/2023   HCT 41.6 02/19/2023   MCV 85 02/19/2023   MCH 27.8 02/19/2023   RDW 14.6 02/19/2023   PLT 256 02/19/2023   Last metabolic panel Lab Results  Component Value Date   GLUCOSE 105 (H) 02/19/2023   NA 140 02/19/2023   K 4.4 02/19/2023   CL 101 02/19/2023   CO2 26 02/19/2023   BUN 10 02/19/2023   CREATININE 1.03 (H) 02/19/2023   EGFR 60 02/19/2023   CALCIUM 9.2 02/19/2023   PROT 6.9 02/19/2023   ALBUMIN 4.3 02/19/2023   LABGLOB 2.6 02/19/2023   BILITOT 0.3 02/19/2023   ALKPHOS 68 02/19/2023   AST 18 02/19/2023   ALT 15 02/19/2023   Last  lipids Lab Results  Component Value Date   CHOL 149 02/19/2023   HDL 54  02/19/2023   LDLCALC 78 02/19/2023   TRIG 89 02/19/2023   CHOLHDL 2.8 02/19/2023   Last hemoglobin A1c Lab Results  Component Value Date   HGBA1C 6.0 (H) 02/19/2023   Last thyroid functions Lab Results  Component Value Date   TSH 1.090 02/19/2023   Last vitamin D Lab Results  Component Value Date   VD25OH 40.2 02/19/2023   Last vitamin B12 and Folate Lab Results  Component Value Date   VITAMINB12 338 02/19/2023   FOLATE 5.9 02/19/2023      Assessment & Plan:    Routine Health Maintenance and Physical Exam  Immunization History  Administered Date(s) Administered   Fluad Quad(high Dose 65+) 05/03/2023   Influenza,inj,Quad PF,6+ Mos 05/13/2021   Influenza-Unspecified 05/01/1996, 05/22/1997, 04/29/2016   Moderna Sars-Covid-2 Vaccination 09/28/2019, 10/31/2019   Pneumococcal Polysaccharide-23 05/01/1996   Tdap 06/08/2016    Health Maintenance  Topic Date Due   Pneumonia Vaccine 38+ Years old (2 of 2 - PCV) 02/15/2023   DEXA SCAN  Never done   COVID-19 Vaccine (3 - 2023-24 season) 06/02/2023 (Originally 03/28/2023)   Zoster Vaccines- Shingrix (1 of 2) 08/17/2023 (Originally 02/15/2008)   Hepatitis C Screening  07/27/2048 (Originally 02/15/1976)   HIV Screening  07/27/2048 (Originally 02/14/1973)   MAMMOGRAM  08/19/2024   DTaP/Tdap/Td (2 - Td or Tdap) 06/08/2026   Cervical Cancer Screening (HPV/Pap Cotest)  07/07/2026   Colonoscopy  07/20/2027   INFLUENZA VACCINE  Completed   HPV VACCINES  Aged Out    Discussed health benefits of physical activity, and encouraged her to engage in regular exercise appropriate for her age and condition.  Problem List Items Addressed This Visit       Encounter for well adult exam with abnormal findings    Annual exam completed today.  Previous records and labs reviewed. -Repeat BMP ordered today to assess renal function -PCV 20 administered today -Will  follow-up on previously ordered DEXA -We will tentatively plan for follow-up in 1 year for annual physical      Elevated serum creatinine - Primary    Cr 1.03 on labs from July, previously 0.79.  GFR mildly reduced at 60.  No prior history of CKD.  Unclear if this represents AKI vs CKD. -Repeat BMP ordered today.      Need for pneumococcal 20-valent conjugate vaccination    PCV 20 administered today      Return in about 1 year (around 05/16/2024) for CPE.  Billie Lade, MD

## 2023-05-17 NOTE — Assessment & Plan Note (Signed)
Cr 1.03 on labs from July, previously 0.79.  GFR mildly reduced at 60.  No prior history of CKD.  Unclear if this represents AKI vs CKD. -Repeat BMP ordered today.

## 2023-05-17 NOTE — Patient Instructions (Addendum)
It was a pleasure to see you today.  Thank you for giving [place] the opportunity to be involved in your care.  Below is a brief recap of your visit and next steps.  We will plan to see you again in 1 year.  Summary Annual physical completed today We will plan for follow up in 1 year Pneumonia vaccine today

## 2023-05-17 NOTE — Assessment & Plan Note (Signed)
PCV 20 administered today 

## 2023-05-17 NOTE — Assessment & Plan Note (Signed)
Annual exam completed today.  Previous records and labs reviewed. -Repeat BMP ordered today to assess renal function -PCV 20 administered today -Will follow-up on previously ordered DEXA -We will tentatively plan for follow-up in 1 year for annual physical

## 2023-05-26 ENCOUNTER — Ambulatory Visit (HOSPITAL_COMMUNITY)
Admission: RE | Admit: 2023-05-26 | Discharge: 2023-05-26 | Disposition: A | Discharge status: Home / Self Care | Payer: 59 | Source: Ambulatory Visit | Attending: Internal Medicine | Admitting: Internal Medicine

## 2023-05-26 DIAGNOSIS — Z780 Asymptomatic menopausal state: Secondary | ICD-10-CM | POA: Diagnosis present

## 2023-09-10 IMAGING — US US THYROID
1 series · 14 of 25 positions shown · non-contrast
Comparison: None.

CLINICAL DATA: Goiter.

EXAM:
THYROID ULTRASOUND
TECHNIQUE: Ultrasound examination of the thyroid gland and adjacent soft
tissues was performed.

[Series 1: us thyroid · 14 of 50 slices shown]
[im 1/50]
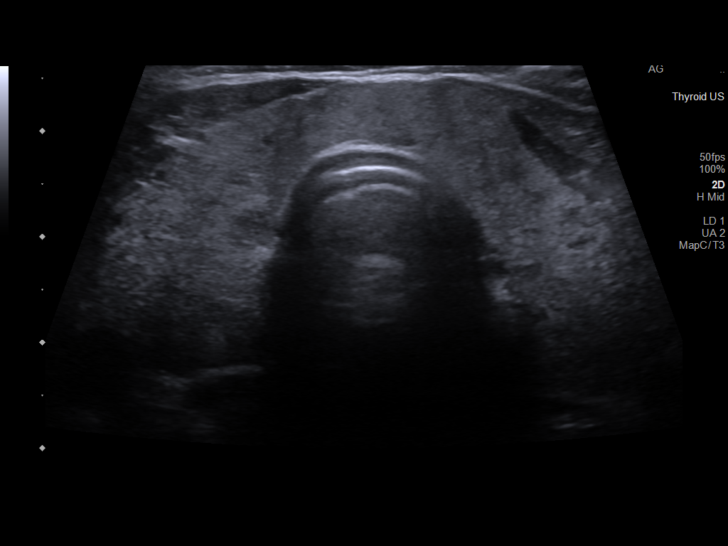
[im 5/50]
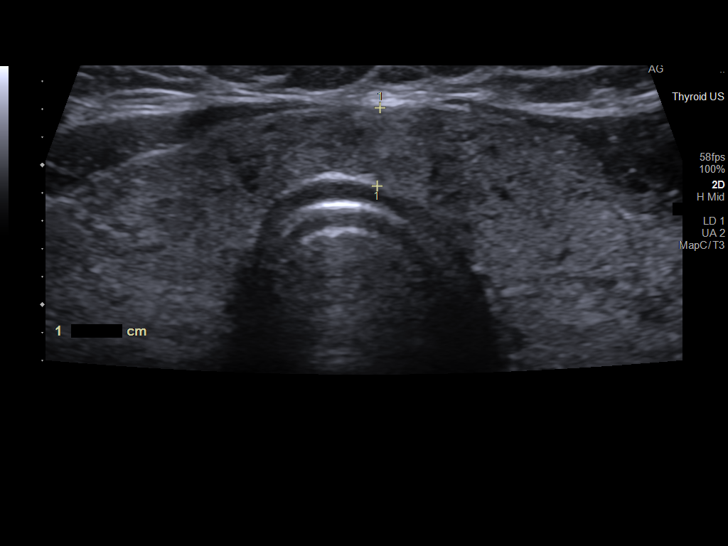
[im 9/50]
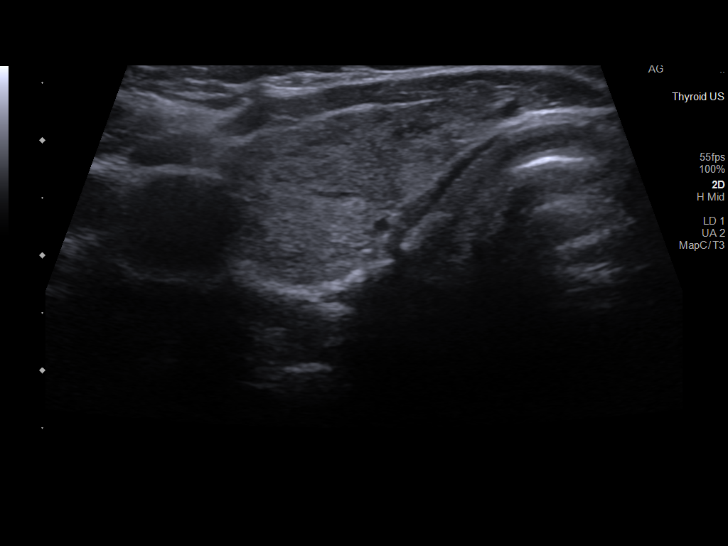
[im 13/50]
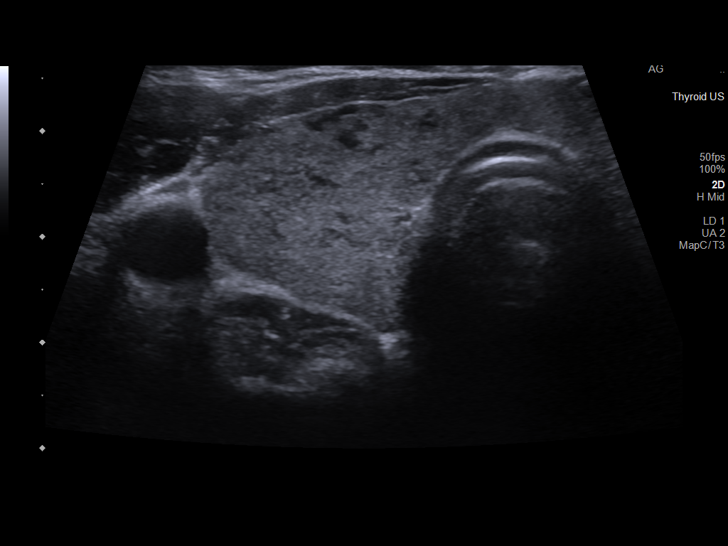
[im 17/50]
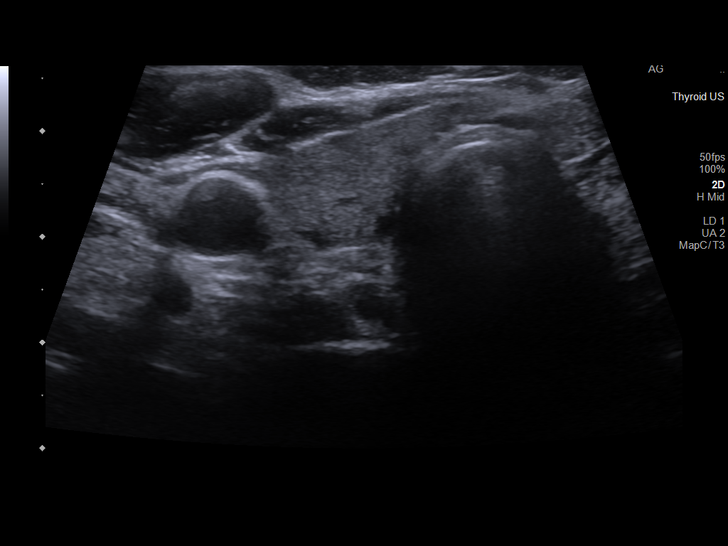
[im 19/50]
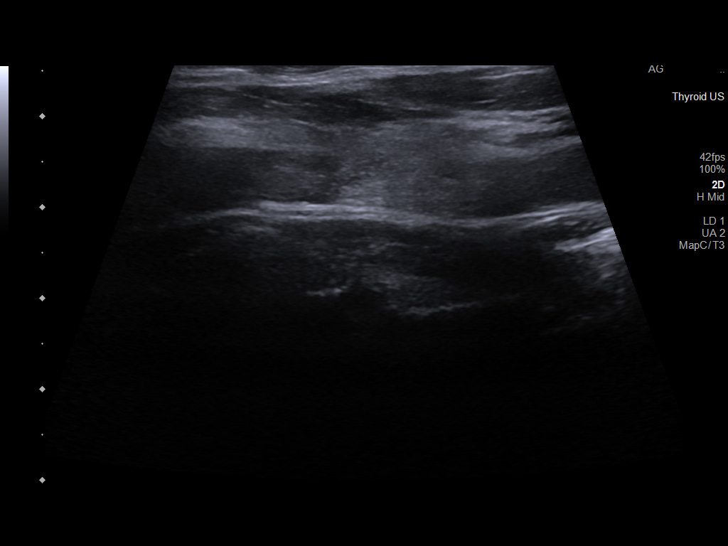
[im 23/50]
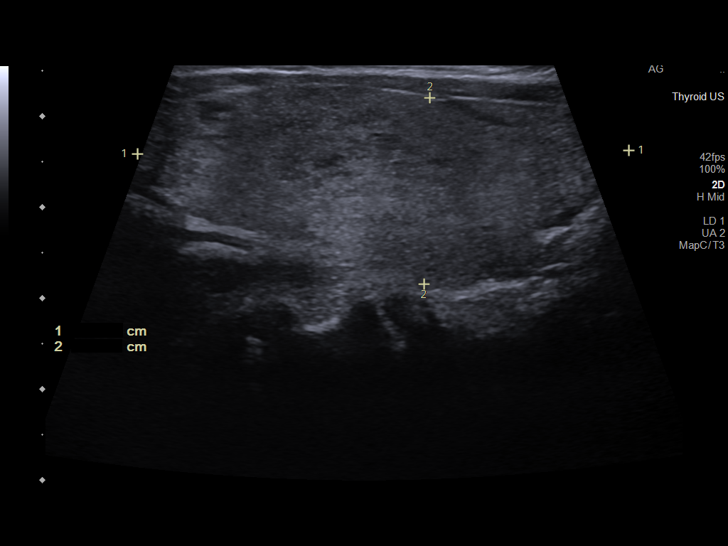
[im 27/50]
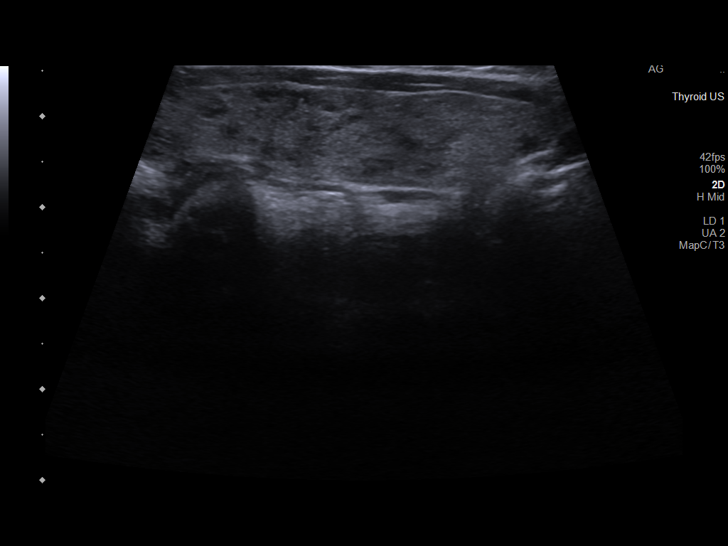
[im 31/50]
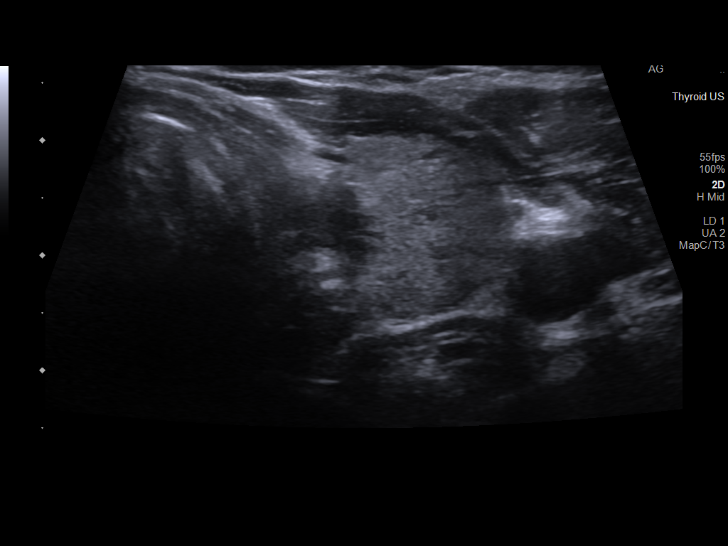
[im 33/50]
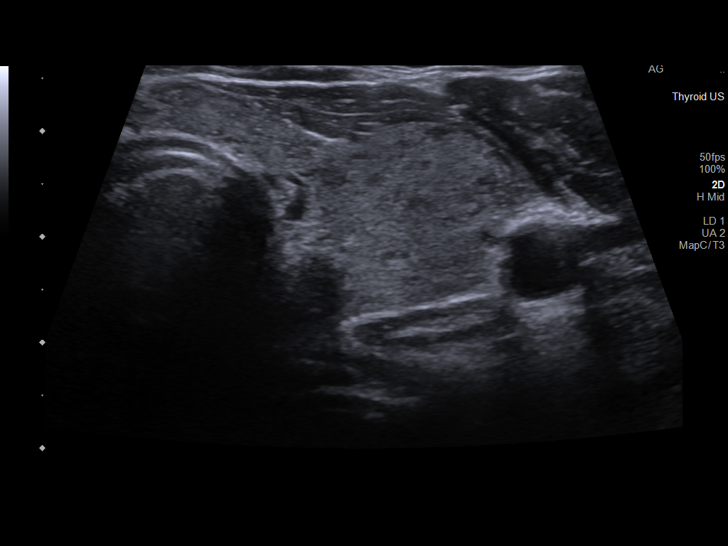
[im 37/50]
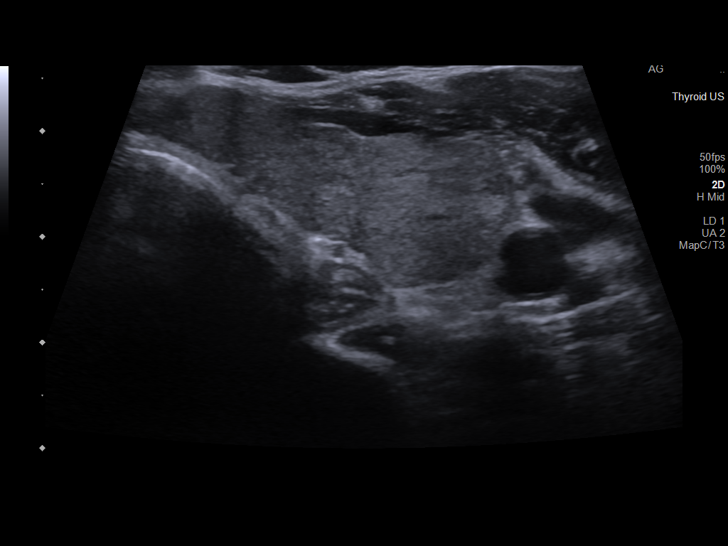
[im 41/50]
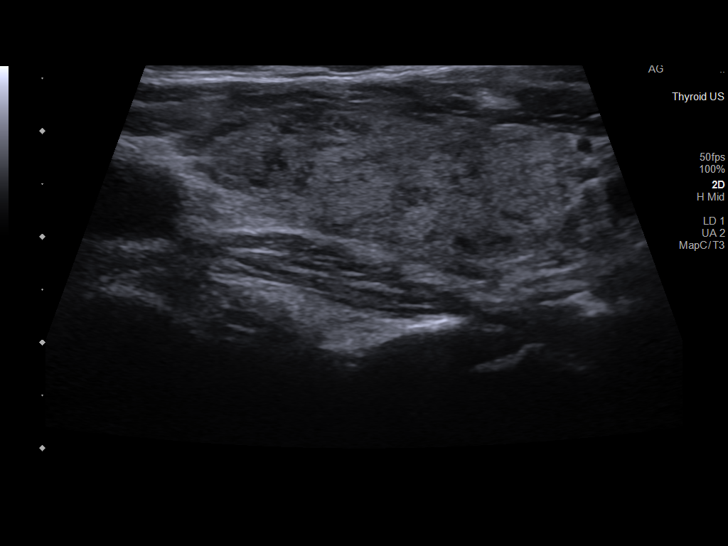
[im 45/50]
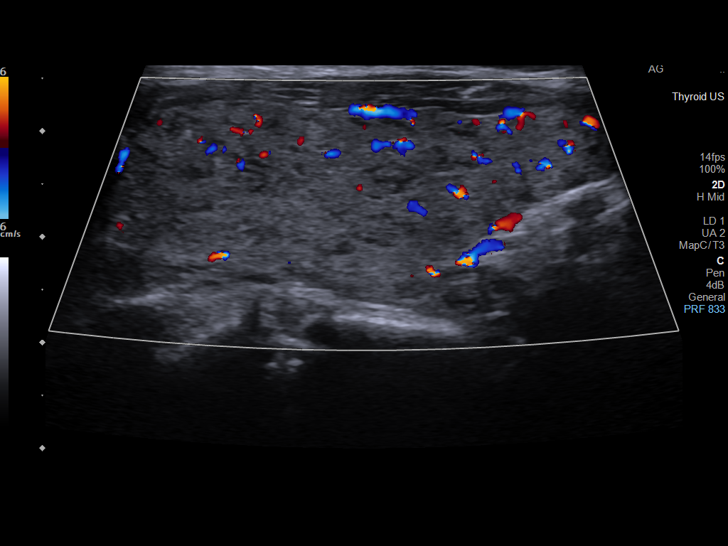
[im 50/50]
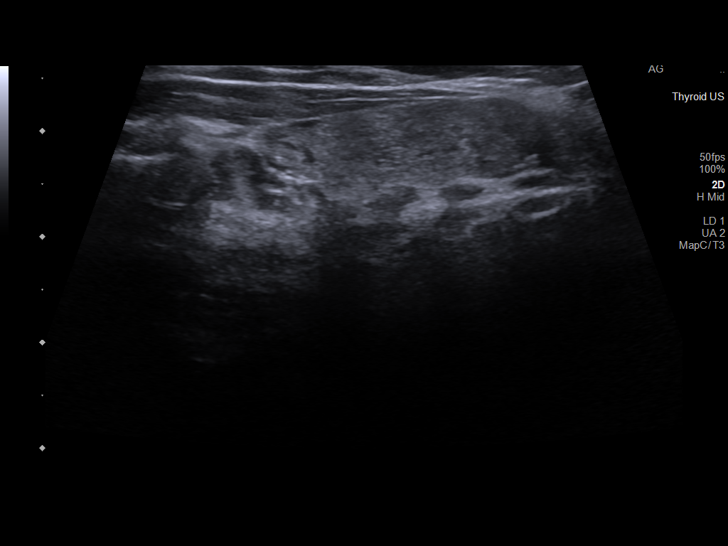

[14 of 25 positions shown; findings below may reference images not displayed]

FINDINGS: Parenchymal Echotexture: Moderately heterogenous

Isthmus: 0.6 cm

Right lobe: 5.4 x 2.1 x 2.2 cm

Left lobe: 5.0 x 1.8 x 2.5 cm

_________________________________________________________

Estimated total number of nodules >/= 1 cm: 0

Number of spongiform nodules >/=  2 cm not described below (TR1): 0

Number of mixed cystic and solid nodules >/= 1.5 cm not described
below (TR2): 0

_________________________________________________________

No discrete nodules are seen within the thyroid gland. No abnormal
lymph nodes identified by ultrasound.
IMPRESSION: Heterogeneous thyroid parenchyma and mildly enlarged thyroid gland.
No discrete focal thyroid nodules are identified.

The above is in keeping with the ACR TI-RADS recommendations - [HOSPITAL] 1291;[DATE].

## 2023-11-24 ENCOUNTER — Encounter: Payer: Self-pay | Admitting: Internal Medicine

## 2023-11-24 ENCOUNTER — Ambulatory Visit: Payer: Self-pay | Admitting: Internal Medicine

## 2023-11-24 VITALS — BP 124/76 | HR 73 | Ht 67.0 in | Wt 200.6 lb

## 2023-11-24 DIAGNOSIS — R2232 Localized swelling, mass and lump, left upper limb: Secondary | ICD-10-CM | POA: Diagnosis not present

## 2023-11-24 NOTE — Progress Notes (Signed)
   Acute Office Visit  Subjective:     Patient ID: Hannah Malone, female    DOB: 02-18-1956, 68 y.o.   MRN: [id number]  Chief Complaint  Patient presents with   Cyst    Knot on left hand   [name] presents today for evaluation of a knot along the dorsal aspect of the left hand and wrist.  She first noticed it about 2 weeks ago.  There was no trauma prior to the development of the knot.  She denies numbness/weakness or discomfort in the left hand.  She does not have any additional concerns to discuss today.  Review of Systems  Musculoskeletal:        Knot on left dorsal hand/wrist  All other systems reviewed and are negative.     Objective:    BP 124/76   Pulse 73   Ht 5\' 7"  (1.702 m)   Wt 200 lb 9.6 oz (91 kg)   SpO2 95%   BMI 31.42 kg/m   Physical Exam Musculoskeletal:        General: Deformity (2 x 2 cm palpable, nontender mass along the dorsal aspect of the left hand, just distal to the wrist.  Strength and sensation are intact in the left hand.) present.       Assessment & Plan:   Problem List Items Addressed This Visit       Mass of left hand - Primary   Presenting today for evaluation of a mass along the dorsal aspect of the left hand present x 2 weeks.  On exam there is a 2 x 2 cm nontender mass present at the base of the dorsal left hand, just distal to the wrist.  The left hand is neurovascularly intact.  The mass seems most consistent with a ganglion cyst.  Treatment options were reviewed.  We discussed a referral to orthopedic surgery for further evaluation and management vs obtaining an ultrasound for better characterization.  In the absence of discomfort, she is not interested in any additional treatment currently.  She will return to care if she develops numbness/tingling, weakness, or discomfort in the left hand.  Otherwise, she will return to care for her annual physical in October 2025.      Return if symptoms worsen or fail to improve.  Tobi Fortes, MD

## 2023-11-24 NOTE — Patient Instructions (Signed)
 It was a pleasure to see you today.  Thank you for giving us  the opportunity to be involved in your care.  Below is a brief recap of your visit and next steps.   Summary I believe the mass on your hand is a ganglion cyst. Please let us  know if it starts causing discomfort, numbness/tingling, or weakness in your hand and we can place a referral to orthopedic surgery.

## 2023-11-24 NOTE — Assessment & Plan Note (Signed)
 Presenting today for evaluation of a mass along the dorsal aspect of the left hand present x 2 weeks.  On exam there is a 2 x 2 cm nontender mass present at the base of the dorsal left hand, just distal to the wrist.  The left hand is neurovascularly intact.  The mass seems most consistent with a ganglion cyst.  Treatment options were reviewed.  We discussed a referral to orthopedic surgery for further evaluation and management vs obtaining an ultrasound for better characterization.  In the absence of discomfort, she is not interested in any additional treatment currently.  She will return to care if she develops numbness/tingling, weakness, or discomfort in the left hand.  Otherwise, she will return to care for her annual physical in October 2025.

## 2023-12-21 ENCOUNTER — Other Ambulatory Visit: Payer: Self-pay

## 2023-12-21 ENCOUNTER — Ambulatory Visit
Admission: EM | Admit: 2023-12-21 | Discharge: 2023-12-21 | Disposition: A | Discharge status: Home / Self Care | Attending: Family Medicine | Admitting: Family Medicine

## 2023-12-21 ENCOUNTER — Encounter: Payer: Self-pay | Admitting: Emergency Medicine

## 2023-12-21 DIAGNOSIS — J22 Unspecified acute lower respiratory infection: Secondary | ICD-10-CM

## 2023-12-21 MED ORDER — AZITHROMYCIN 250 MG PO TABS: ORAL_TABLET | ORAL | 0 refills | Status: DC

## 2023-12-21 MED ORDER — PROMETHAZINE-DM 6.25-15 MG/5ML PO SYRP: 5.0000 mL | ORAL_SOLUTION | Freq: Four times a day (QID) | ORAL | 0 refills | Status: DC | PRN

## 2023-12-21 NOTE — ED Triage Notes (Addendum)
 Pt reports cough fatigue for last several weeks. Pt reports intermittent nasal congestion as well. Has tried otc medication with no change in symptoms.

## 2023-12-21 NOTE — ED Provider Notes (Signed)
[facility]    CSN: [id number] Arrival date & time: 12/21/23  1245      History   Chief Complaint No chief complaint on file.   HPI Hannah Malone is a 68 y.o. female.   Presenting today with 2-week history of progressively worsening productive cough, congestion, sinus pain and pressure, fatigue.  Denies fever, chest pain, shortness of breath, abdominal pain, vomiting, diarrhea.  So far trying over-the-counter cold and congestion medication with no relief.  No known history of chronic pulmonary disease.    History reviewed. No pertinent past medical history.  Patient Active Problem List   Diagnosis Date Noted   Mass of left hand 11/24/2023   Encounter for well adult exam with abnormal findings 05/17/2023   Elevated serum creatinine 05/17/2023   Need for pneumococcal 20-valent conjugate vaccination 05/17/2023   Screening for osteoporosis 02/19/2023   Obesity 06/08/2016    Past Surgical History:  Procedure Laterality Date   TONSILECTOMY/ADENOIDECTOMY WITH MYRINGOTOMY Bilateral 1964   TONSILLECTOMY      OB History   No obstetric history on file.      Home Medications    Prior to Admission medications   Medication Sig Start Date End Date Taking? Authorizing Provider  azithromycin (ZITHROMAX) 250 MG tablet Take first 2 tablets together, then 1 every day until finished. 12/21/23  Yes Corbin Dess, PA-C  promethazine-dextromethorphan (PROMETHAZINE-DM) 6.25-15 MG/5ML syrup Take 5 mLs by mouth 4 (four) times daily as needed. 12/21/23  Yes Corbin Dess, PA-C  aspirin EC 81 MG tablet Take 81 mg by mouth daily.    [provider]  Calcium  Carbonate-Vitamin D  300-100 MG-UNIT CAPS Take 1,000 mg/day by mouth daily. 07/12/20   Austine Lefort, MD  fluticasone  (FLONASE ) 50 MCG/ACT nasal spray Place 2 sprays into both nostrils daily. 10/11/19   Jerilyn Monte, FNP  loratadine  (CLARITIN ) 10 MG tablet Take 1 tablet (10 mg total) by mouth  daily. 10/11/19   Jerilyn Monte, FNP  multivitamin-iron-minerals-folic acid (CENTRUM) chewable tablet Chew 1 tablet by mouth daily.    [provider]    Family History Family History  Problem Relation Age of Onset   Arthritis Mother    Hyperlipidemia Mother    Hypertension Mother    Vision loss Mother        Glaucoma   Hyperlipidemia Father    Birth defects Sister    Early death Sister    Cancer Maternal Aunt        lung cancer    Arthritis Maternal Grandfather    Colon cancer Maternal Grandfather    Colon polyps Neg Hx    Rectal cancer Neg Hx    Stomach cancer Neg Hx     Social History Social History   Tobacco Use   Smoking status: Never   Smokeless tobacco: Never  Vaping Use   Vaping status: Never Used  Substance Use Topics   Alcohol use: Yes    Comment: occ   Drug use: No     Allergies   Patient has no known allergies.   Review of Systems Review of Systems Per HPI  Physical Exam Triage Vital Signs ED Triage Vitals [12/21/23 1251]  Encounter Vitals Group     BP (!) 147/94     Systolic BP Percentile      Diastolic BP Percentile      Pulse Rate 78     Resp 20     Temp 98.5 F (36.9 C)  Temp Source Oral     SpO2 95 %     Weight      Height      Head Circumference      Peak Flow      Pain Score 0     Pain Loc      Pain Education      Exclude from Growth Chart    No data found.  Updated Vital Signs BP (!) 147/94 (BP Location: Right Arm)   Pulse 78   Temp 98.5 F (36.9 C) (Oral)   Resp 20   SpO2 95%   Visual Acuity Right Eye Distance:   Left Eye Distance:   Bilateral Distance:    Right Eye Near:   Left Eye Near:    Bilateral Near:     Physical Exam Vitals and nursing note reviewed.  Constitutional:      Appearance: Normal appearance.  HENT:     Head: Atraumatic.     Right Ear: Tympanic membrane and external ear normal.     Left Ear: Tympanic membrane and external ear normal.     Nose: Congestion present.      Mouth/Throat:     Mouth: Mucous membranes are moist.     Pharynx: Posterior oropharyngeal erythema present.  Eyes:     Extraocular Movements: Extraocular movements intact.     Conjunctiva/sclera: Conjunctivae normal.  Cardiovascular:     Rate and Rhythm: Normal rate and regular rhythm.     Heart sounds: Normal heart sounds.  Pulmonary:     Effort: Pulmonary effort is normal.     Breath sounds: Normal breath sounds. No wheezing or rales.  Musculoskeletal:        General: Normal range of motion.     Cervical back: Normal range of motion and neck supple.  Skin:    General: Skin is warm and dry.  Neurological:     Mental Status: She is alert and oriented to person, place, and time.  Psychiatric:        Mood and Affect: Mood normal.        Thought Content: Thought content normal.      UC Treatments / Results  Labs (all labs ordered are listed, but only abnormal results are displayed) Labs Reviewed - No data to display  EKG   Radiology No results found.  Procedures Procedures (including critical care time)  Medications Ordered in UC Medications - No data to display  Initial Impression / Assessment and Plan / UC Course  I have reviewed the triage vital signs and the nursing notes.  Pertinent labs & imaging results that were available during my care of the patient were reviewed by me and considered in my medical decision making (see chart for details).     Given duration and worsening course, will treat with Zithromax, Phenergan DM, supportive over-the-counter medications and home care.  Return for worsening symptoms.  Final Clinical Impressions(s) / UC Diagnoses   Final diagnoses:  Lower respiratory infection   Discharge Instructions   None    ED Prescriptions     Medication Sig Dispense Auth. Provider   azithromycin (ZITHROMAX) 250 MG tablet Take first 2 tablets together, then 1 every day until finished. 6 tablet Corbin Dess, PA-C    promethazine-dextromethorphan (PROMETHAZINE-DM) 6.25-15 MG/5ML syrup Take 5 mLs by mouth 4 (four) times daily as needed. 100 mL Corbin Dess, [place]      PDMP not reviewed this encounter.   Corbin Dess, [place] 12/21/23  1425  

## 2024-05-09 DIAGNOSIS — Z23 Encounter for immunization: Secondary | ICD-10-CM | POA: Diagnosis not present

## 2024-05-18 ENCOUNTER — Ambulatory Visit

## 2024-05-18 VITALS — BP 132/85 | HR 71 | Ht 67.0 in | Wt 200.0 lb

## 2024-05-18 DIAGNOSIS — Z0000 Encounter for general adult medical examination without abnormal findings: Secondary | ICD-10-CM | POA: Diagnosis not present

## 2024-05-18 DIAGNOSIS — Z23 Encounter for immunization: Secondary | ICD-10-CM | POA: Diagnosis not present

## 2024-05-18 NOTE — Progress Notes (Signed)
 Established Patient Office Visit  Subjective   Patient ID: Hannah Malone, female    DOB: Nov 18, 1955  Age: 68 y.o. MRN: [id number]  Chief Complaint  Patient presents with   Medical Management of Chronic Issues    CPE    HPI Discussed the use of AI scribe software for clinical note transcription with the patient, who gave verbal consent to proceed.  History of Present Illness   Hannah Malone is a 68 year old female who presents for an annual physical exam.  Mood and mental health - No anxiety - No depression  Gastrointestinal function - Regular bowel movements - No changes in bowel habits  Immunization status - Received influenza vaccine last week - Has not received pneumococcal or shingles vaccines - Has not received COVID-19 vaccine and is uncertain about its efficacy  Medication and supplement use - No prescription medications - Occasionally uses over-the-counter remedies for sinus infections - Takes vitamin C and B3 gummies - Difficulty establishing a routine for regular multivitamin intake      Patient Active Problem List   Diagnosis Date Noted   Mass of left hand 11/24/2023   Encounter for well adult exam with abnormal findings 05/17/2023   Elevated serum creatinine 05/17/2023   Need for pneumococcal 20-valent conjugate vaccination 05/17/2023   Screening for osteoporosis 02/19/2023   Obesity 06/08/2016    ROS    Objective:     BP 132/85   Pulse 71   Ht 5' 7 (1.702 m)   Wt 200 lb 0.6 oz (90.7 kg)   SpO2 98%   BMI 31.33 kg/m  BP Readings from Last 3 Encounters:  05/18/24 132/85  12/21/23 (!) 147/94  11/24/23 124/76   Wt Readings from Last 3 Encounters:  05/18/24 200 lb 0.6 oz (90.7 kg)  11/24/23 200 lb 9.6 oz (91 kg)  05/17/23 194 lb 9.6 oz (88.3 kg)      Physical Exam Vitals and nursing note reviewed.  Constitutional:      Appearance: Normal appearance.  HENT:     Head: Normocephalic.     Right Ear: Tympanic membrane, ear canal  and external ear normal.     Left Ear: Tympanic membrane, ear canal and external ear normal.     Nose: Nose normal.     Mouth/Throat:     Mouth: Mucous membranes are moist.     Pharynx: Oropharynx is clear.  Eyes:     Extraocular Movements: Extraocular movements intact.     Pupils: Pupils are equal, round, and reactive to light.  Cardiovascular:     Rate and Rhythm: Normal rate and regular rhythm.  Pulmonary:     Effort: Pulmonary effort is normal.     Breath sounds: Normal breath sounds.  Abdominal:     General: Bowel sounds are normal.     Palpations: Abdomen is soft.  Musculoskeletal:        General: Normal range of motion.     Cervical back: Normal range of motion and neck supple.  Skin:    General: Skin is warm and dry.  Neurological:     Mental Status: She is alert and oriented to person, place, and time.  Psychiatric:        Mood and Affect: Mood normal.        Thought Content: Thought content normal.       The 10-year ASCVD risk score (Arnett DK, et al., 2019) is: 7.2%    Assessment & Plan:   Problem  List Items Addressed This Visit   None Visit Diagnoses       Encounter for preventative adult health care examination    -  Primary   Routine wellness visit with no acute issues. Blood pressure normal. No anxiety or depression. No current medications except over-the-counter supplements.   Relevant Orders   CMP14+EGFR (Completed)   Lipid Profile (Completed)   TSH + free T4 (Completed)   HgB A1c (Completed)   CBC with Differential/Platelet (Completed)     Need for shingles vaccine       - Administer first dose of shingles vaccine today. - Schedule nurse visit for second dose of shingles vaccine in January.   Relevant Orders   Zoster Recombinant (Shingrix  ) (Completed)       No follow-ups on file.    Leita Longs, FNP

## 2024-05-19 LAB — CMP14+EGFR
ALT: 20 IU/L (ref 0–32)
AST: 18 IU/L (ref 0–40)
Albumin: 4.1 g/dL (ref 3.9–4.9)
Alkaline Phosphatase: 79 IU/L (ref 49–135)
BUN/Creatinine Ratio: 13 (ref 12–28)
BUN: 10 mg/dL (ref 8–27)
Bilirubin Total: 0.5 mg/dL (ref 0.0–1.2)
CO2: 26 mmol/L (ref 20–29)
Calcium: 9.4 mg/dL (ref 8.7–10.3)
Chloride: 101 mmol/L (ref 96–106)
Creatinine, Ser: 0.79 mg/dL (ref 0.57–1.00)
Globulin, Total: 2.5 g/dL (ref 1.5–4.5)
Glucose: 94 mg/dL (ref 70–99)
Potassium: 4 mmol/L (ref 3.5–5.2)
Sodium: 141 mmol/L (ref 134–144)
Total Protein: 6.6 g/dL (ref 6.0–8.5)
eGFR: 82 mL/min/1.73 (ref >59)

## 2024-05-19 LAB — CBC WITH DIFFERENTIAL/PLATELET
Basophils Absolute: 0 x10E3/uL (ref 0.0–0.2)
Basos: 1 %
EOS (ABSOLUTE): 0.3 x10E3/uL (ref 0.0–0.4)
Eos: 3 %
Hematocrit: 41.1 % (ref 34.0–46.6)
Hemoglobin: 13.3 g/dL (ref 11.1–15.9)
Immature Grans (Abs): 0 x10E3/uL (ref 0.0–0.1)
Immature Granulocytes: 0 %
Lymphocytes Absolute: 2.5 x10E3/uL (ref 0.7–3.1)
Lymphs: 30 %
MCH: 28.3 pg (ref 26.6–33.0)
MCHC: 32.4 g/dL (ref 31.5–35.7)
MCV: 87 fL (ref 79–97)
Monocytes Absolute: 0.5 x10E3/uL (ref 0.1–0.9)
Monocytes: 6 %
Neutrophils Absolute: 5.1 x10E3/uL (ref 1.4–7.0)
Neutrophils: 60 %
Platelets: 259 x10E3/uL (ref 150–450)
RBC: 4.7 x10E6/uL (ref 3.77–5.28)
RDW: 14.7 % (ref 11.7–15.4)
WBC: 8.4 x10E3/uL (ref 3.4–10.8)

## 2024-05-19 LAB — LIPID PANEL
Chol/HDL Ratio: 2.6 ratio (ref 0.0–4.4)
Cholesterol, Total: 155 mg/dL (ref 100–199)
HDL: 60 mg/dL (ref >39)
LDL Chol Calc (NIH): 80 mg/dL (ref 0–99)
Triglycerides: 78 mg/dL (ref 0–149)
VLDL Cholesterol Cal: 15 mg/dL (ref 5–40)

## 2024-05-19 LAB — HEMOGLOBIN A1C
Est. average glucose Bld gHb Est-mCnc: 128 mg/dL
Hgb A1c MFr Bld: 6.1 % — ABNORMAL HIGH (ref 4.8–5.6)

## 2024-05-19 LAB — TSH+FREE T4
Free T4: 1.26 ng/dL (ref 0.82–1.77)
TSH: 3.3 u[IU]/mL (ref 0.450–4.500)

## 2024-05-21 ENCOUNTER — Ambulatory Visit: Payer: Self-pay

## 2024-05-22 ENCOUNTER — Telehealth: Payer: Self-pay

## 2024-05-22 NOTE — Telephone Encounter (Signed)
 Called to let pt know that Her labs were all within normal range.  Recommend follow-up in 6 months or sooner if needed.

## 2024-07-24 ENCOUNTER — Other Ambulatory Visit: Payer: Self-pay

## 2024-08-02 ENCOUNTER — Ambulatory Visit

## 2024-08-02 DIAGNOSIS — Z23 Encounter for immunization: Secondary | ICD-10-CM | POA: Diagnosis not present

## 2024-08-02 NOTE — Progress Notes (Signed)
 Patient is in office today for a nurse visit for Immunization. Patient Injection was given in the  Left deltoid. Patient tolerated injection well.

## 2025-05-21 ENCOUNTER — Encounter
